# Patient Record
Sex: Female | Born: 1960 | Race: White | Hispanic: No | Marital: Married | Smoking: Never smoker
Health system: Southern US, Community
[De-identification: ages and names within clinical notes are randomized; demographics above are authoritative.]

## PROBLEM LIST (undated history)

## (undated) DIAGNOSIS — Z801 Family history of malignant neoplasm of trachea, bronchus and lung: Secondary | ICD-10-CM

## (undated) DIAGNOSIS — Z803 Family history of malignant neoplasm of breast: Secondary | ICD-10-CM

## (undated) DIAGNOSIS — C801 Malignant (primary) neoplasm, unspecified: Secondary | ICD-10-CM

## (undated) DIAGNOSIS — J45909 Unspecified asthma, uncomplicated: Secondary | ICD-10-CM

## (undated) HISTORY — PX: THYROID SURGERY: SHX805

## (undated) HISTORY — PX: BREAST SURGERY: SHX581

## (undated) HISTORY — DX: Family history of malignant neoplasm of trachea, bronchus and lung: Z80.1

## (undated) HISTORY — DX: Family history of malignant neoplasm of breast: Z80.3

---

## 2020-02-11 ENCOUNTER — Other Ambulatory Visit: Payer: Self-pay | Admitting: Physician Assistant

## 2020-02-11 ENCOUNTER — Ambulatory Visit
Admission: RE | Admit: 2020-02-11 | Discharge: 2020-02-11 | Disposition: A | Discharge status: Home / Self Care | Payer: BC Managed Care – PPO | Source: Ambulatory Visit | Attending: Physician Assistant | Admitting: Physician Assistant

## 2020-02-11 DIAGNOSIS — R918 Other nonspecific abnormal finding of lung field: Secondary | ICD-10-CM

## 2020-02-11 DIAGNOSIS — R222 Localized swelling, mass and lump, trunk: Secondary | ICD-10-CM

## 2020-02-13 ENCOUNTER — Ambulatory Visit
Admission: RE | Admit: 2020-02-13 | Discharge: 2020-02-13 | Disposition: A | Discharge status: Home / Self Care | Payer: BC Managed Care – PPO | Source: Ambulatory Visit | Attending: Physician Assistant | Admitting: Physician Assistant

## 2020-02-13 ENCOUNTER — Other Ambulatory Visit: Payer: Self-pay

## 2020-02-13 DIAGNOSIS — R222 Localized swelling, mass and lump, trunk: Secondary | ICD-10-CM

## 2020-02-13 DIAGNOSIS — R918 Other nonspecific abnormal finding of lung field: Secondary | ICD-10-CM

## 2020-02-13 MED ORDER — IOPAMIDOL (ISOVUE-300) INJECTION 61%
75.0000 mL | Freq: Once | INTRAVENOUS | Status: AC | PRN
  Administered 2020-02-13: 75 mL via INTRAVENOUS

## 2020-02-20 ENCOUNTER — Telehealth: Payer: Self-pay | Admitting: Hematology and Oncology

## 2020-02-20 NOTE — Progress Notes (Signed)
[facility] NOTE  Patient Care Team: Ephriam Jenkins, PA as PCP - General (Physician Assistant)  CHIEF COMPLAINTS/PURPOSE OF CONSULTATION:  Newly diagnosed breast cancer  HISTORY OF PRESENTING ILLNESS:  Melanie Davis 60 y.o. female is here because of recent diagnosis of metastatic breast cancer. She has a history of right breast cancer in 2010 for which she underwent a right mastectomy with reconstruction in 2011. She was diagnosed with left breast cancer in 2016 and underwent a left mastectomy with reconstruction on 12/04/2014 for which pathology showed invasive ductal carcinoma, grade 2, ER+ 75%, PR+ 75%, HER-2 negative (1+), clear margins, 2 left axillary lymph nodes negative for carcinoma. She recently palpated a left breast mass for about 6 weeks. Chest CT on 02/11/20 showed multiple bilateral pulmonary nodules consistent with metastatic disease. She presents to the clinic today for initial evaluation and discussion of treatment options.   I reviewed her records extensively and collaborated the history with the patient.  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 CT chest 02/10/2018 [facility]: Heterogeneous mass left anterior chest bordering the sternum 6.2 x 4 x 4.2 cm.  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  MEDICATIONS:  Current Outpatient Medications  Medication Sig Dispense Refill  . anastrozole (ARIMIDEX) 1 MG tablet Take 1 tablet (1 mg total) by mouth daily. 90 tablet 3  . triamcinolone ointment (KENALOG) 0.5 % Apply 1 application topically 2 (two) times daily. 30 g 0   No current facility-administered medications for this visit.    REVIEW OF SYSTEMS:   Constitutional: Denies fevers, chills or abnormal night sweats Skin: Skin rash on the left side of the neck Breast: Palpable left anterior chest wall mass All other systems were reviewed with the patient and are negative.  PHYSICAL EXAMINATION: ECOG PERFORMANCE STATUS: 1 - Symptomatic but completely ambulatory  Vitals:   02/21/20 1324  BP: (!) 141/88  Pulse: 83  Resp: 17  Temp: 97.9 F (36.6 C)  SpO2: 99%   Filed Weights   02/21/20 1324  Weight: 152 lb 4.8 oz (69.1 kg)    GENERAL:alert, no distress and comfortable SKIN: skin color, texture, turgor are normal, no rashes or significant lesions EYES: normal, conjunctiva are pink and non-injected, sclera clear OROPHARYNX:no exudate, no erythema and lips, buccal mucosa, and tongue normal  NECK: supple, thyroid normal size, non-tender, without nodularity LYMPH:  no palpable lymphadenopathy in the cervical, axillary or inguinal LUNGS: clear to auscultation and percussion with normal breathing effort HEART: regular rate & rhythm and no murmurs and no lower extremity edema  ABDOMEN:abdomen soft, non-tender and normal bowel sounds Musculoskeletal:no cyanosis of digits and no clubbing  PSYCH: alert & oriented x 3 with fluent speech NEURO: no focal motor/sensory  deficits BREAST: Left medial anterior chest wall mass (exam performed in the presence of a chaperone)     LABORATORY DATA:  I have reviewed the data as listed Lab Results  Component Value Date   WBC 6.8 02/21/2020   HGB 14.8 02/21/2020   HCT 45.7 02/21/2020   MCV 97.0 02/21/2020   PLT 231 02/21/2020   Lab Results  Component Value Date   NA 142 02/21/2020   K 4.3 02/21/2020   CL 107 02/21/2020   CO2 26 02/21/2020    RADIOGRAPHIC STUDIES: I have personally reviewed the radiological reports and agreed with the findings in the report.  ASSESSMENT AND PLAN:  Metastatic breast cancer ([place]) Right breast cancer in 2010 for which she underwent a right mastectomy with reconstruction in 2011.  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   Recurrence: She recently palpated a left chest wall mass for about 6 weeks.  Chest CT on 02/11/20 showed left anterior chest mass 6.2 cm.  Multiple bilateral pulmonary nodules consistent with metastatic disease  Treatment plan: 1.  Biopsy of the breast and the chest wall mass 2. PET CT scan  Treatment will depend on prognostic markers. Enrolled her in the blood draw study for metastatic breast cancer.   All questions were answered. The patient knows to call the clinic with any problems, questions or concerns.   Rulon Eisenmenger, MD, MPH 02/21/2020    I, Molly Dorshimer, am acting as scribe for Nicholas Lose, MD.  I have reviewed the above documentation for accuracy and completeness, and I agree with the above.

## 2020-02-20 NOTE — Telephone Encounter (Signed)
Received a new pt referral from [facility] for multiple bilateral pulm nodules found on CT consistent w/metastatic disease. Pt has a personal hx of brca. Ms. Bo cld to schedule an app w/Dr. Lindi Adie on 1/21 at 1pm. Pt aware to arrive 20 minutes early.

## 2020-02-21 ENCOUNTER — Inpatient Hospital Stay: Payer: BC Managed Care – PPO | Attending: Hematology and Oncology

## 2020-02-21 ENCOUNTER — Other Ambulatory Visit: Payer: Self-pay

## 2020-02-21 ENCOUNTER — Inpatient Hospital Stay (HOSPITAL_BASED_OUTPATIENT_CLINIC_OR_DEPARTMENT_OTHER): Payer: BC Managed Care – PPO | Admitting: Hematology and Oncology

## 2020-02-21 DIAGNOSIS — Z86000 Personal history of in-situ neoplasm of breast: Secondary | ICD-10-CM | POA: Diagnosis not present

## 2020-02-21 DIAGNOSIS — Z9013 Acquired absence of bilateral breasts and nipples: Secondary | ICD-10-CM | POA: Diagnosis not present

## 2020-02-21 DIAGNOSIS — C7801 Secondary malignant neoplasm of right lung: Secondary | ICD-10-CM

## 2020-02-21 DIAGNOSIS — C50812 Malignant neoplasm of overlapping sites of left female breast: Secondary | ICD-10-CM | POA: Insufficient documentation

## 2020-02-21 DIAGNOSIS — C50919 Malignant neoplasm of unspecified site of unspecified female breast: Secondary | ICD-10-CM

## 2020-02-21 DIAGNOSIS — C7802 Secondary malignant neoplasm of left lung: Secondary | ICD-10-CM

## 2020-02-21 LAB — CMP (CANCER CENTER ONLY)
ALT: 18 U/L (ref 0–44)
AST: 20 U/L (ref 15–41)
Albumin: 4.3 g/dL (ref 3.5–5.0)
Alkaline Phosphatase: 60 U/L (ref 38–126)
Anion gap: 9 (ref 5–15)
BUN: 13 mg/dL (ref 6–20)
CO2: 26 mmol/L (ref 22–32)
Calcium: 9.4 mg/dL (ref 8.9–10.3)
Chloride: 107 mmol/L (ref 98–111)
Creatinine: 0.87 mg/dL (ref 0.44–1.00)
GFR, Estimated: >60 mL/min (ref >60)
Glucose, Bld: 99 mg/dL (ref 70–99)
Potassium: 4.3 mmol/L (ref 3.5–5.1)
Sodium: 142 mmol/L (ref 135–145)
Total Bilirubin: 0.4 mg/dL (ref 0.3–1.2)
Total Protein: 7.2 g/dL (ref 6.5–8.1)

## 2020-02-21 LAB — CBC WITH DIFFERENTIAL (CANCER CENTER ONLY)
Abs Immature Granulocytes: 0.02 10*3/uL (ref 0.00–0.07)
Basophils Absolute: 0.1 10*3/uL (ref 0.0–0.1)
Basophils Relative: 1 %
Eosinophils Absolute: 0.4 10*3/uL (ref 0.0–0.5)
Eosinophils Relative: 6 %
HCT: 45.7 % (ref 36.0–46.0)
Hemoglobin: 14.8 g/dL (ref 12.0–15.0)
Immature Granulocytes: 0 %
Lymphocytes Relative: 31 %
Lymphs Abs: 2.1 10*3/uL (ref 0.7–4.0)
MCH: 31.4 pg (ref 26.0–34.0)
MCHC: 32.4 g/dL (ref 30.0–36.0)
MCV: 97 fL (ref 80.0–100.0)
Monocytes Absolute: 0.4 10*3/uL (ref 0.1–1.0)
Monocytes Relative: 6 %
Neutro Abs: 3.8 10*3/uL (ref 1.7–7.7)
Neutrophils Relative %: 56 %
Platelet Count: 231 10*3/uL (ref 150–400)
RBC: 4.71 MIL/uL (ref 3.87–5.11)
RDW: 13.2 % (ref 11.5–15.5)
WBC Count: 6.8 10*3/uL (ref 4.0–10.5)
nRBC: 0 % (ref 0.0–0.2)

## 2020-02-21 LAB — RESEARCH LABS

## 2020-02-21 MED ORDER — TRIAMCINOLONE ACETONIDE 0.5 % EX OINT: 1.0000 "application " | TOPICAL_OINTMENT | Freq: Two times a day (BID) | CUTANEOUS | 0 refills | Status: AC

## 2020-02-21 MED ORDER — ANASTROZOLE 1 MG PO TABS: 1.0000 mg | ORAL_TABLET | Freq: Every day | ORAL | 3 refills | Status: DC

## 2020-02-21 NOTE — Assessment & Plan Note (Addendum)
Right breast cancer in 2010 for which she underwent a right mastectomy with reconstruction in 2011.  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   Recurrence: She recently palpated a left chest wall mass for about 6 weeks.  Chest CT on 02/11/20 showed left anterior chest mass 6.2 cm.  Multiple bilateral pulmonary nodules consistent with metastatic disease  Treatment plan: 1.  Biopsy of the breast and the chest wall mass 2. PET CT scan  Treatment will depend on prognostic markers. Enrolled her in the blood draw study for metastatic breast cancer.

## 2020-02-24 ENCOUNTER — Other Ambulatory Visit: Payer: Self-pay | Admitting: Hematology and Oncology

## 2020-02-24 ENCOUNTER — Encounter (HOSPITAL_COMMUNITY): Payer: Self-pay | Admitting: Radiology

## 2020-02-24 DIAGNOSIS — C50919 Malignant neoplasm of unspecified site of unspecified female breast: Secondary | ICD-10-CM

## 2020-02-24 NOTE — Progress Notes (Signed)
[facility] Female, 60 y.o., 08-29-1960  MRN:  [id number] Phone:  [phone] Jerilynn Mages)       PCP:  Ephriam Jenkins, PA Coverage:  Sher[facility] P[facility]  Next Appt With Radiology ([facility]-US 2) 02/28/2020 at 8:00 AM           RE: STAT  US CORE BIOPSY (SOFT TISSUE) Received: Today Criselda Peaches, MD  Garth Bigness D  Discussed with Dr. Lindi Adie. Remote hx of breast cancer now with what appears to be bulky metastatic left IMA chain lymphadenopathy. Enough intercostal extension to allow for US guided core biopsy.   Pt is the [employer].   HKM        Previous Messages   ----- Message -----  From: Garth Bigness D  Sent: 02/24/2020 11:55 AM EST  To: Criselda Peaches, MD  Subject: STAT  US CORE BIOPSY (SOFT TISSUE)        Procedure: [place] CORE BIOPSY (SOFT TISSUE)   Reason: Metastatic breast cancer, Chest wall mass,  Discussed with Dr. Laurence Ferrari   History: CT in computer, NM PET to be scheduled   Provider: Nicholas Lose   Provider Contact: [phone]

## 2020-02-27 ENCOUNTER — Other Ambulatory Visit: Payer: Self-pay | Admitting: Radiology

## 2020-02-27 NOTE — H&P (Incomplete)
Chief Complaint: Patient was seen in consultation today for US-guided core biopsy  Referring Physician(s): Nicholas Lose  Supervising Physician: {Supervising Physician:[id number]}  Patient Status: [facility] - Out-pt  History of Present Illness: Melanie Davis is a 60 y.o. female with a medical history significant for HTN, asthma, thyroidectomy, hysterectomy and bilateral breast cancer. She underwent a right mastectomy with reconstruction in 2011 and a left mastectomy with reconstruction in 2016. She recently palpated a left chest wall mass and imaging was obtained.   CT chest with contrast 02/13/20 IMPRESSION: 1. 6.2 x 4.0 x 4.2 cm mass that involves the anterior left upper lobe and chest wall. An origin from the chest wall is suspected supported by the internal mammary vessels traveling through the mass. The mass does not erode the contiguous left sternum or adjacent anterior second and third left rib cartilages. Metastatic disease from breast carcinoma is suspected, possibly arising along the left internal mammary chain. 2. Multiple bilateral lung nodules as described consistent with pulmonary metastatic disease. 3. No other findings of metastatic disease within the chest or visualized upper abdomen. 4. No acute findings. 5. Low-attenuation liver masses are noted that are consistent with cysts.  Interventional Radiology has been asked to evaluate this patient for an image-guided chest wall mass biopsy for further work up and evaluation. Images have been reviewed and procedure approved by Dr. Laurence Ferrari.   No past medical history on file.  *** The histories are not reviewed yet. Please review them in the "History" navigator section and refresh this [place].  Allergies: Patient has no known allergies.  Medications: Prior to Admission medications   Medication Sig Start Date End Date Taking? Authorizing Provider  albuterol (VENTOLIN HFA) 108 (90 Base) MCG/ACT inhaler Inhale 1 puff  into the lungs every 4 (four) hours as needed for wheezing or shortness of breath.   Yes [provider]  anastrozole (ARIMIDEX) 1 MG tablet Take 1 tablet (1 mg total) by mouth daily. 02/21/20  Yes Nicholas Lose, MD  beclomethasone (QVAR) 40 MCG/ACT inhaler Inhale 2 puffs into the lungs 2 (two) times daily.   Yes [provider]  fluticasone (FLONASE) 50 MCG/ACT nasal spray Place 1 spray into both nostrils daily as needed for allergies or rhinitis.   Yes [provider]  levothyroxine (SYNTHROID) 100 MCG tablet Take 100 mcg by mouth daily before breakfast.   Yes [provider]  lisinopril (ZESTRIL) 5 MG tablet Take 5 mg by mouth daily.   Yes [provider]  triamcinolone ointment (KENALOG) 0.5 % Apply 1 application topically 2 (two) times daily. 02/21/20   Nicholas Lose, MD     No family history on file.  Social History   Socioeconomic History  . Marital status: Unknown    Spouse name: Not on file  . Number of children: Not on file  . Years of education: Not on file  . Highest education level: Not on file  Occupational History  . Not on file  Tobacco Use  . Smoking status: Not on file  . Smokeless tobacco: Not on file  Substance and Sexual Activity  . Alcohol use: Not on file  . Drug use: Not on file  . Sexual activity: Not on file  Other Topics Concern  . Not on file  Social History Narrative  . Not on file   Social Determinants of Health   Financial Resource Strain: Not on file  Food Insecurity: Not on file  Transportation Needs: Not on file  Physical Activity: Not  on file  Stress: Not on file  Social Connections: Not on file    Review of Systems: A 12 point ROS discussed and pertinent positives are indicated in the HPI above.  All other systems are negative.  Review of Systems  Vital Signs: There were no vitals taken for this visit.  Physical Exam  Imaging: DG Chest 2 View  Result Date: 02/11/2020 CLINICAL DATA:   Left chest wall mass for 2 weeks. Prior left mastectomy in 2015. Right mastectomy in 2010. History of hypertension and asthma EXAM: CHEST - 2 VIEW COMPARISON:  None. FINDINGS: Heart size is within normal limits. No pulmonary vascular congestion. Surgical clips noted in the left axilla and midline upper thorax. There are 2 possible nodules in the left lower lung measuring 7 mm each. Lungs otherwise clear. There is a masslike opacity in the retrosternal region seen on the lateral view measuring approximately 5.9 x 2.7 cm. IMPRESSION: 1. No acute cardiopulmonary process. 2. Masslike opacity in the retrosternal region seen on the lateral view measuring approximately 5.9 x 2.7 cm. 3. Two 7 mm nodular opacity seen in the left lower lung. 4. Further evaluation with contrast enhanced chest CT should be performed to evaluate these findings. These results will be called to the ordering clinician or representative by the Radiologist Assistant, and communication documented in the PACS or [employer]. Electronically Signed   By: Miachel Roux M.D.   On: 02/11/2020 09:40   CT CHEST W CONTRAST  Result Date: 02/13/2020 CLINICAL DATA:  Masslike opacity in the retrosternal region on a lateral chest radiograph performed 02/11/2020, also showing 2 nodular opacities in the left lower lung. Current exam is for further assessment. History of prior mastectomies. EXAM: CT CHEST WITH CONTRAST TECHNIQUE: Multidetector CT imaging of the chest was performed during intravenous contrast administration. CONTRAST:  7mL ISOVUE-300 IOPAMIDOL (ISOVUE-300) INJECTION 61% COMPARISON:  Chest radiograph, 02/11/2020. FINDINGS: Cardiovascular: Heart is normal in size and configuration. No pericardial effusion or coronary artery calcifications. Mild atherosclerotic calcifications along the aortic arch. Great vessels otherwise unremarkable. Aortic arch branch vessels are widely patent. Mediastinum/Nodes: Surgical vascular clips at the neck base  consistent with prior thyroidectomy. No neck base or axillary masses or enlarged lymph nodes. The trachea and esophagus are unremarkable. Lungs/Pleura: Heterogeneous mass in the left anterior chest, bordering the left margin of the sternum, involving the chest wall and mildly displacing the overlying medial pectoralis major muscle anteriorly. There is no resorption of the sternum where it abuts the mass or the associated rib cartilages, which are the second and third left rib cartilages. This mass measures 6.2 x 4.0 x 4.2 cm. The internal mammary vessels track through the mass. Multiple small pulmonary nodules bilaterally, larger and somewhat more numerous on the left. The larger nodules are measured. In the left upper lobe, image 63, series 3, there is a 5 mm nodule. In the left lower lobe, image 94, series 3, there is a 7 mm nodule. There are 2 adjacent nodules in the left lower lobe, largest measuring 5 mm, image 98, series 3. 6 mm nodule in the left upper lobe lingula, image 116. Largest right nodule lies in the right middle lobe measuring 5 mm. There are hazy, linear and reticulonodular type opacities in the anterior left upper lobe adjacent to the above described mass. No evidence of pneumonia or pulmonary edema. No pleural effusion and no pneumothorax. Upper Abdomen: 4 low-density liver masses, largest at the dome of the right lobe measuring 3 cm. These  are likely cysts. Visualized upper abdomen is otherwise unremarkable. Musculoskeletal: Fractures of the right lateral seventh and eighth ribs that appear chronic to subacute. No acute fractures. No osteoblastic or osteolytic lesions. Bilateral mastectomies, implant reconstruction on the left. No other chest wall masses. IMPRESSION: 1. 6.2 x 4.0 x 4.2 cm mass that involves the anterior left upper lobe and chest wall. An origin from the chest wall is suspected supported by the internal mammary vessels traveling through the mass. The mass does not erode the  contiguous left sternum or adjacent anterior second and third left rib cartilages. Metastatic disease from breast carcinoma is suspected, possibly arising along the left internal mammary chain. 2. Multiple bilateral lung nodules as described consistent with pulmonary metastatic disease. 3. No other findings of metastatic disease within the chest or visualized upper abdomen. 4. No acute findings. 5. Low-attenuation liver masses are noted that are consistent with cysts. Aortic Atherosclerosis (ICD10-I70.0). Electronically Signed   By: Lajean Manes M.D.   On: 02/13/2020 14:50    Labs:  CBC: Recent Labs    02/21/20 1411  WBC 6.8  HGB 14.8  HCT 45.7  PLT 231    COAGS: No results for input(s): INR, APTT in the last 8760 hours.  BMP: Recent Labs    02/21/20 1411  NA 142  K 4.3  CL 107  CO2 26  GLUCOSE 99  BUN 13  CALCIUM 9.4  CREATININE 0.87  GFRNONAA >60    LIVER FUNCTION TESTS: Recent Labs    02/21/20 1411  BILITOT 0.4  AST 20  ALT 18  ALKPHOS 60  PROT 7.2  ALBUMIN 4.3    TUMOR MARKERS: No results for input(s): AFPTM, CEA, CA199, CHROMGRNA in the last 8760 hours.  Assessment and Plan:  Breast cancer with suspected metastases: Melanie Davis, 60 year old female, presents today to the [facility] department for an image-guided chest wall mass biopsy.   Risks and benefits of this procedure were discussed with the patient and/or patient's family including, but not limited to bleeding, infection, damage to adjacent structures or low yield requiring additional tests.  All of the questions were answered and there is agreement to proceed.  Consent signed and in chart.  Thank you for this interesting consult.  I greatly enjoyed meeting Melanie Davis and look forward to participating in their care.  A copy of this report was sent to the requesting provider on this date.  Electronically Signed: Soyla Dryer, AGACNP-BC [id number] 02/27/2020,  10:22 AM   I spent a total of  30 Minutes   in face to face in clinical consultation, greater than 50% of which was counseling/coordinating care for chest wall mass biopsy.

## 2020-02-28 ENCOUNTER — Other Ambulatory Visit: Payer: Self-pay | Admitting: Hematology and Oncology

## 2020-02-28 ENCOUNTER — Ambulatory Visit (HOSPITAL_COMMUNITY)
Admission: RE | Admit: 2020-02-28 | Discharge: 2020-02-28 | Disposition: A | Discharge status: Home / Self Care | Payer: BC Managed Care – PPO | Source: Ambulatory Visit | Attending: Hematology and Oncology | Admitting: Hematology and Oncology

## 2020-02-28 ENCOUNTER — Other Ambulatory Visit: Payer: Self-pay

## 2020-02-28 ENCOUNTER — Encounter (HOSPITAL_COMMUNITY): Payer: Self-pay

## 2020-02-28 DIAGNOSIS — C50919 Malignant neoplasm of unspecified site of unspecified female breast: Secondary | ICD-10-CM | POA: Diagnosis not present

## 2020-02-28 HISTORY — DX: Unspecified asthma, uncomplicated: J45.909

## 2020-02-28 HISTORY — DX: Malignant (primary) neoplasm, unspecified: C80.1

## 2020-02-28 LAB — CBC
HCT: 44.4 % (ref 36.0–46.0)
Hemoglobin: 14.8 g/dL (ref 12.0–15.0)
MCH: 32.3 pg (ref 26.0–34.0)
MCHC: 33.3 g/dL (ref 30.0–36.0)
MCV: 96.9 fL (ref 80.0–100.0)
Platelets: 237 10*3/uL (ref 150–400)
RBC: 4.58 MIL/uL (ref 3.87–5.11)
RDW: 13.4 % (ref 11.5–15.5)
WBC: 5.5 10*3/uL (ref 4.0–10.5)
nRBC: 0 % (ref 0.0–0.2)

## 2020-02-28 LAB — PROTIME-INR
INR: 0.9 (ref 0.8–1.2)
Prothrombin Time: 11.4 seconds (ref 11.4–15.2)

## 2020-02-28 MED ORDER — SODIUM CHLORIDE 0.9 % IV SOLN: INTRAVENOUS | Status: DC

## 2020-02-28 MED ORDER — MIDAZOLAM HCL 2 MG/2ML IJ SOLN
INTRAMUSCULAR | Status: AC
  Filled 2020-02-28: qty 2

## 2020-02-28 MED ORDER — MIDAZOLAM HCL 2 MG/2ML IJ SOLN
INTRAMUSCULAR | Status: AC | PRN
  Administered 2020-02-28: 1 mg via INTRAVENOUS
  Administered 2020-02-28: 0.5 mg via INTRAVENOUS

## 2020-02-28 MED ORDER — FENTANYL CITRATE (PF) 100 MCG/2ML IJ SOLN
INTRAMUSCULAR | Status: AC | PRN
  Administered 2020-02-28: 50 ug via INTRAVENOUS
  Administered 2020-02-28: 25 ug via INTRAVENOUS

## 2020-02-28 MED ORDER — FENTANYL CITRATE (PF) 100 MCG/2ML IJ SOLN
INTRAMUSCULAR | Status: AC
  Filled 2020-02-28: qty 2

## 2020-02-28 NOTE — Sedation Documentation (Signed)
This RN placed call to Short Stay unit to give report, spoke to [place].  Short Stay unable to take report at this time. Will try again shortly.

## 2020-02-28 NOTE — Procedures (Signed)
Interventional Radiology Procedure Note  Procedure:   CT guided biopsy of chest wall mass.  Mx 18g core.  Complications: None  Recommendations:  - routine wound care - Do not submerge x 7 days - Routine care  - advance diet - 1 hr dc home  Signed,  Dulcy Fanny. Earleen Newport, DO

## 2020-02-28 NOTE — Consult Note (Signed)
Chief Complaint: Patient was seen in consultation today for image guided left chest wall mass biopsy  Referring Physician(s): Kinta  Supervising Physician: Corrie Mckusick  Patient Status: [facility] - Out-pt  History of Present Illness: Melanie Davis is a 60 y.o. female with history of right breast cancer in 2010 with mastectomy and reconstruction as well as left breast cancer in 2016 with mastectomy and reconstruction.  For the past months she has noticed an enlarging solid left upper chest wall mass which is slightly tender to palpation.  CT chest performed on 02/13/20 revealed:  1. 6.2 x 4.0 x 4.2 cm mass that involves the anterior left upper lobe and chest wall. An origin from the chest wall is suspected supported by the internal mammary vessels traveling through the mass. The mass does not erode the contiguous left sternum or adjacent anterior second and third left rib cartilages. Metastatic disease from breast carcinoma is suspected, possibly arising along the left internal mammary chain. 2. Multiple bilateral lung nodules as described consistent with pulmonary metastatic disease. 3. No other findings of metastatic disease within the chest or visualized upper abdomen. 4. No acute findings. 5. Low-attenuation liver masses are noted that are consistent with Cysts.  She presents today for image guided left chest wall mass biopsy for further evaluation.  Past Medical History:  Diagnosis Date  . Asthma   . Cancer [facility])     Past Surgical History:  Procedure Laterality Date  . BREAST SURGERY    . THYROID SURGERY      Allergies: Patient has no known allergies.  Medications: Prior to Admission medications   Medication Sig Start Date End Date Taking? Authorizing Provider  albuterol (VENTOLIN HFA) 108 (90 Base) MCG/ACT inhaler Inhale 1 puff into the lungs every 4 (four) hours as needed for wheezing or shortness of breath.   Yes [provider]  anastrozole  (ARIMIDEX) 1 MG tablet Take 1 tablet (1 mg total) by mouth daily. 02/21/20  Yes Nicholas Lose, MD  beclomethasone (QVAR) 40 MCG/ACT inhaler Inhale into the lungs 2 (two) times daily.   Yes [provider]  fluticasone (FLONASE) 50 MCG/ACT nasal spray Place 1 spray into both nostrils daily as needed for allergies or rhinitis.   Yes [provider]  levothyroxine (SYNTHROID) 100 MCG tablet Take 100 mcg by mouth daily before breakfast.   Yes [provider]  lisinopril (ZESTRIL) 5 MG tablet Take 5 mg by mouth daily.   Yes [provider]  triamcinolone ointment (KENALOG) 0.5 % Apply 1 application topically 2 (two) times daily. 02/21/20  Yes Nicholas Lose, MD     Family History  Problem Relation Age of Onset  . Stroke Mother   . Pancreatitis Father     Social History   Socioeconomic History  . Marital status: Unknown    Spouse name: Not on file  . Number of children: Not on file  . Years of education: Not on file  . Highest education level: Not on file  Occupational History  . Not on file  Tobacco Use  . Smoking status: Never Smoker  . Smokeless tobacco: Never Used  Vaping Use  . Vaping Use: Never used  Substance and Sexual Activity  . Alcohol use: Yes    Comment: social  . Drug use: Never  . Sexual activity: Not on file  Other Topics Concern  . Not on file  Social History Narrative  . Not on file   Social Determinants of Health   Financial  Resource Strain: Not on file  Food Insecurity: Not on file  Transportation Needs: Not on file  Physical Activity: Not on file  Stress: Not on file  Social Connections: Not on file      Review of Systems see above; currently denies fever, headache, dyspnea, cough, abdominal/back pain, nausea, vomiting or bleeding.  Vital Signs: BP (!) 148/78   Pulse 76   Temp 97.6 F (36.4 C) (Oral)   Resp 16   Ht 5\' 6"  (1.676 m)   Wt 153 lb (69.4 kg)   SpO2 97%   BMI 24.69 kg/m   Physical Exam awake,  alert.  Chest clear to auscultation bilaterally.  Left ant/medial upper chest wall mass noted, slightly tender to palpation.  Heart with regular rate and rhythm.  Abdomen soft, positive bowel sounds, nontender.  No lower extremity edema.  Imaging: DG Chest 2 View  Result Date: 02/11/2020 CLINICAL DATA:  Left chest wall mass for 2 weeks. Prior left mastectomy in 2015. Right mastectomy in 2010. History of hypertension and asthma EXAM: CHEST - 2 VIEW COMPARISON:  None. FINDINGS: Heart size is within normal limits. No pulmonary vascular congestion. Surgical clips noted in the left axilla and midline upper thorax. There are 2 possible nodules in the left lower lung measuring 7 mm each. Lungs otherwise clear. There is a masslike opacity in the retrosternal region seen on the lateral view measuring approximately 5.9 x 2.7 cm. IMPRESSION: 1. No acute cardiopulmonary process. 2. Masslike opacity in the retrosternal region seen on the lateral view measuring approximately 5.9 x 2.7 cm. 3. Two 7 mm nodular opacity seen in the left lower lung. 4. Further evaluation with contrast enhanced chest CT should be performed to evaluate these findings. These results will be called to the ordering clinician or representative by the Radiologist Assistant, and communication documented in the PACS or [employer]. Electronically Signed   By: Miachel Roux M.D.   On: 02/11/2020 09:40   CT CHEST W CONTRAST  Result Date: 02/13/2020 CLINICAL DATA:  Masslike opacity in the retrosternal region on a lateral chest radiograph performed 02/11/2020, also showing 2 nodular opacities in the left lower lung. Current exam is for further assessment. History of prior mastectomies. EXAM: CT CHEST WITH CONTRAST TECHNIQUE: Multidetector CT imaging of the chest was performed during intravenous contrast administration. CONTRAST:  53mL ISOVUE-300 IOPAMIDOL (ISOVUE-300) INJECTION 61% COMPARISON:  Chest radiograph, 02/11/2020. FINDINGS: Cardiovascular:  Heart is normal in size and configuration. No pericardial effusion or coronary artery calcifications. Mild atherosclerotic calcifications along the aortic arch. Great vessels otherwise unremarkable. Aortic arch branch vessels are widely patent. Mediastinum/Nodes: Surgical vascular clips at the neck base consistent with prior thyroidectomy. No neck base or axillary masses or enlarged lymph nodes. The trachea and esophagus are unremarkable. Lungs/Pleura: Heterogeneous mass in the left anterior chest, bordering the left margin of the sternum, involving the chest wall and mildly displacing the overlying medial pectoralis major muscle anteriorly. There is no resorption of the sternum where it abuts the mass or the associated rib cartilages, which are the second and third left rib cartilages. This mass measures 6.2 x 4.0 x 4.2 cm. The internal mammary vessels track through the mass. Multiple small pulmonary nodules bilaterally, larger and somewhat more numerous on the left. The larger nodules are measured. In the left upper lobe, image 63, series 3, there is a 5 mm nodule. In the left lower lobe, image 94, series 3, there is a 7 mm nodule. There are 2 adjacent nodules  in the left lower lobe, largest measuring 5 mm, image 98, series 3. 6 mm nodule in the left upper lobe lingula, image 116. Largest right nodule lies in the right middle lobe measuring 5 mm. There are hazy, linear and reticulonodular type opacities in the anterior left upper lobe adjacent to the above described mass. No evidence of pneumonia or pulmonary edema. No pleural effusion and no pneumothorax. Upper Abdomen: 4 low-density liver masses, largest at the dome of the right lobe measuring 3 cm. These are likely cysts. Visualized upper abdomen is otherwise unremarkable. Musculoskeletal: Fractures of the right lateral seventh and eighth ribs that appear chronic to subacute. No acute fractures. No osteoblastic or osteolytic lesions. Bilateral mastectomies,  implant reconstruction on the left. No other chest wall masses. IMPRESSION: 1. 6.2 x 4.0 x 4.2 cm mass that involves the anterior left upper lobe and chest wall. An origin from the chest wall is suspected supported by the internal mammary vessels traveling through the mass. The mass does not erode the contiguous left sternum or adjacent anterior second and third left rib cartilages. Metastatic disease from breast carcinoma is suspected, possibly arising along the left internal mammary chain. 2. Multiple bilateral lung nodules as described consistent with pulmonary metastatic disease. 3. No other findings of metastatic disease within the chest or visualized upper abdomen. 4. No acute findings. 5. Low-attenuation liver masses are noted that are consistent with cysts. Aortic Atherosclerosis (ICD10-I70.0). Electronically Signed   By: Lajean Manes M.D.   On: 02/13/2020 14:50    Labs:  CBC: Recent Labs    02/21/20 1411 02/28/20 0625  WBC 6.8 5.5  HGB 14.8 14.8  HCT 45.7 44.4  PLT 231 237    COAGS: Recent Labs    02/28/20 0625  INR 0.9    BMP: Recent Labs    02/21/20 1411  NA 142  K 4.3  CL 107  CO2 26  GLUCOSE 99  BUN 13  CALCIUM 9.4  CREATININE 0.87  GFRNONAA >60    LIVER FUNCTION TESTS: Recent Labs    02/21/20 1411  BILITOT 0.4  AST 20  ALT 18  ALKPHOS 60  PROT 7.2  ALBUMIN 4.3    TUMOR MARKERS: No results for input(s): AFPTM, CEA, CA199, CHROMGRNA in the last 8760 hours.  Assessment and Plan: 60 y.o. female with history of right breast cancer in 2010 with mastectomy and reconstruction as well as left breast cancer in 2016 with mastectomy and reconstruction.  For the past months she has noticed an enlarging solid left upper chest wall mass which is slightly tender to palpation.  CT chest performed on 02/13/20 revealed:  1. 6.2 x 4.0 x 4.2 cm mass that involves the anterior left upper lobe and chest wall. An origin from the chest wall is suspected supported by the  internal mammary vessels traveling through the mass. The mass does not erode the contiguous left sternum or adjacent anterior second and third left rib cartilages. Metastatic disease from breast carcinoma is suspected, possibly arising along the left internal mammary chain. 2. Multiple bilateral lung nodules as described consistent with pulmonary metastatic disease. 3. No other findings of metastatic disease within the chest or visualized upper abdomen. 4. No acute findings. 5. Low-attenuation liver masses are noted that are consistent with Cysts.  She presents today for image guided left chest wall mass biopsy for further evaluation.Risks and benefits of procedure was discussed with the patient  including, but not limited to bleeding, infection, damage to adjacent structures,  pneumothorax or low yield requiring additional tests.  All of the questions were answered and there is agreement to proceed.  Consent signed and in chart.     Thank you for this interesting consult.  I greatly enjoyed meeting Ashwika Freels and look forward to participating in their care.  A copy of this report was sent to the requesting provider on this date.  Electronically Signed: D. Rowe Robert, PA-C 02/28/2020, 7:33 AM   I spent a total of 25 minutes  in face to face in clinical consultation, greater than 50% of which was counseling/coordinating care for image guided left chest wall mass biopsy

## 2020-02-28 NOTE — Discharge Instructions (Addendum)
Percutaneous Kidney Biopsy, Care After This sheet gives you information about how to care for yourself after your procedure. Your health care provider may also give you more specific instructions. If you have problems or questions, contact your health care provider. What can I expect after the procedure? After the procedure, it is common to have:  Pain or soreness near the biopsy site.  Pink or cloudy urine for 24 hours after the procedure. This is normal. Follow these instructions at home: Activity  Return to your normal activities as told by your health care provider. Ask your health care provider what activities are safe for you.  If you were given a sedative during the procedure, it can affect you for several hours. Do not drive or operate machinery until your health care provider says that it is safe.  Do not lift anything that is heavier than 10 lb (4.5 kg), or the limit that you are told, until your health care provider says that it is safe.  Avoid activities that take a lot of effort until your health care provider approves. Most people will have to wait 2 weeks before returning to activities such as exercise or sex. General instructions  Take over-the-counter and prescription medicines only as told by your health care provider.  Follow instructions from your health care provider about eating or drinking restrictions.  Check your biopsy site every day for signs of infection. Check for: ? More redness, swelling, or pain. ? Fluid or blood. ? Warmth. ? Pus or a bad smell.  Keep all follow-up visits as told by your health care provider. This is important.   Contact a health care provider if:  You have more redness, swelling, or pain around your biopsy site.  You have fluid or blood coming from your biopsy site.  Your biopsy site feels warm to the touch.  You have pus or a bad smell coming from your biopsy site.  You have blood in your urine more than 24 hours after your  procedure. Get help right away if:  Your urine is dark red or brown.  You have a fever.  You are not able to urinate.  You feel burning when you urinate.  You feel dizzy or light-headed.  You have severe pain in your abdomen or side. Summary  After the procedure, it is common to have pain or soreness at the biopsy site and pink or cloudy urine for the first 24 hours.  Check your biopsy site each day for signs of infection, such as more redness, swelling, or pain; fluid, blood, pus or a bad smell coming from the biopsy site; or the biopsy site feeling warm to the touch.  Return to your normal activities as told by your health care provider. This information is not intended to replace advice given to you by your health care provider. Make sure you discuss any questions you have with your health care provider. Document Revised: 04/12/2019 Document Reviewed: 04/12/2019 Elsevier Patient Education  2021 Else[employer] Inc. Moderate Conscious Sedation, Adult Sedation is the use of medicines to promote relaxation and to relieve discomfort and anxiety. Moderate conscious sedation is a type of sedation. Under moderate conscious sedation, you are less alert than normal, but you are still able to respond to instructions, touch, or both. Moderate conscious sedation is used during short medical and dental procedures. It is milder than deep sedation, which is a type of sedation under which you cannot be easily woken up. It is also milder than   general anesthesia, which is the use of medicines to make you unconscious. Moderate conscious sedation allows you to return to your regular activities sooner. Tell a health care provider about:  Any allergies you have.  All medicines you are taking, including vitamins, herbs, eye drops, creams, and over-the-counter medicines.  Any use of steroids. This includes steroids taken by mouth or as a cream.  Any problems you or family members have had with sedatives and  anesthetic medicines.  Any blood disorders you have.  Any surgeries you have had.  Any medical conditions you have, such as sleep apnea.  Whether you are pregnant or may be pregnant.  Any use of cigarettes, alcohol, marijuana, or drugs. What are the risks? Generally, this is a safe procedure. However, problems may occur, including:  Getting too much medicine (oversedation).  Nausea.  Allergic reaction to medicines.  Trouble breathing. If this happens, a breathing tube may be used. It will be removed when you are awake and breathing on your own.  Heart trouble.  Lung trouble.  Confusion that gets better with time (emergence delirium). What happens before the procedure? Staying hydrated Follow instructions from your health care provider about hydration, which may include:  Up to 2 hours before the procedure - you may continue to drink clear liquids, such as water, clear fruit juice, black coffee, and plain tea. Eating and drinking restrictions Follow instructions from your health care provider about eating and drinking, which may include:  8 hours before the procedure - stop eating heavy meals or foods, such as meat, fried foods, or fatty foods.  6 hours before the procedure - stop eating light meals or foods, such as toast or cereal.  6 hours before the procedure - stop drinking milk or drinks that contain milk.  2 hours before the procedure - stop drinking clear liquids. Medicines Ask your health care provider about:  Changing or stopping your regular medicines. This is especially important if you are taking diabetes medicines or blood thinners.  Taking medicines such as aspirin and ibuprofen. These medicines can thin your blood. Do not take these medicines unless your health care provider tells you to take them.  Taking over-the-counter medicines, vitamins, herbs, and supplements. Tests and exams  You will have a physical exam.  You may have blood tests done to  show how well: ? Your kidneys and liver work. ? Your blood clots. General instructions  Plan to have a responsible adult take you home from the hospital or clinic.  If you will be going home right after the procedure, plan to have a responsible adult care for you for the time you are told. This is important. What happens during the procedure?  You will be given the sedative. The sedative may be given: ? As a pill that you will swallow. It can also be inserted into the rectum. ? As a spray through the nose. ? As an injection into the muscle. ? As an injection into the vein through an IV.  You may be given oxygen as needed.  Your breathing, heart rate, and blood pressure will be monitored during the procedure.  The medical or dental procedure will be done. The procedure may vary among health care providers and hospitals.   What happens after the procedure?  Your blood pressure, heart rate, breathing rate, and blood oxygen level will be monitored until you leave the hospital or clinic.  You will get fluids through your IV if needed.  Do not drive   or operate machinery until your health care provider says that it is safe. Summary  Sedation is the use of medicines to promote relaxation and to relieve discomfort and anxiety. Moderate conscious sedation is a type of sedation that is used during short medical and dental procedures.  Tell the health care provider about any medical conditions that you have and about all the medicines that you are taking.  You will be given the sedative as a pill, a spray through the nose, an injection into the muscle, or an injection into the vein through an IV. Vital signs are monitored during the sedation.  Moderate conscious sedation allows you to return to your regular activities sooner. This information is not intended to replace advice given to you by your health care provider. Make sure you discuss any questions you have with your health care  provider. Document Revised: 05/17/2019 Document Reviewed: 12/13/2018 Elsevier Patient Education  2021 [employer] Inc.  

## 2020-03-03 ENCOUNTER — Encounter: Payer: Self-pay | Admitting: Adult Health

## 2020-03-03 ENCOUNTER — Encounter: Payer: Self-pay | Admitting: Hematology and Oncology

## 2020-03-04 ENCOUNTER — Other Ambulatory Visit: Payer: Self-pay

## 2020-03-04 ENCOUNTER — Ambulatory Visit (HOSPITAL_COMMUNITY)
Admission: RE | Admit: 2020-03-04 | Discharge: 2020-03-04 | Disposition: A | Discharge status: Home / Self Care | Payer: BC Managed Care – PPO | Source: Ambulatory Visit | Attending: Hematology and Oncology | Admitting: Hematology and Oncology

## 2020-03-04 DIAGNOSIS — C50919 Malignant neoplasm of unspecified site of unspecified female breast: Secondary | ICD-10-CM | POA: Diagnosis not present

## 2020-03-04 LAB — GLUCOSE, CAPILLARY: Glucose-Capillary: 90 mg/dL (ref 70–99)

## 2020-03-04 LAB — SURGICAL PATHOLOGY

## 2020-03-04 MED ORDER — FLUDEOXYGLUCOSE F - 18 (FDG) INJECTION
7.5100 | Freq: Once | INTRAVENOUS | Status: AC | PRN
  Administered 2020-03-04: 7.51 via INTRAVENOUS

## 2020-03-04 NOTE — Progress Notes (Signed)
[facility] up:    Ephriam Jenkins, PA [address] [place] [address]   DIAGNOSIS: Cancer Staging No matching staging information was found for the patient.  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 CT chest 02/10/2018 [facility]: Heterogeneous mass left anterior chest bordering the sternum 6.2 x 4 x 4.2 cm.  Multiple bilateral lung nodules LUL: 5 mm, LLL: 7 mm, 2 adjacent nodules LLL: 5 mm, LUL lingula: 6 mm, RML: 5 mm, hazy linear reticulonodular opacities, 4 low-density liver masses 3 cm likely cysts     CURRENT THERAPY:  INTERVAL HISTORY: Melanie Davis 60 y.o. female returns for evaluation of her left chest wall biopsy results.  She underwent a PET scan on 03/03/2020 that showed a hypermetabolic right chest wall mass, and lung nodules that are consistent with pulmonary metastases, however less than 42m.  She was started on anastrozole daily and tolerates it well. She notes some mild tenderness over the left chest wall mass, but otherwise is doing well today without any concerns.     Patient Active Problem List   Diagnosis Date Noted  . Metastatic breast cancer ([place] 02/21/2020    has No Known Allergies.  MEDICAL HISTORY: Past Medical History:  Diagnosis Date  . Asthma   . Cancer ([facility]     SURGICAL HISTORY: Past Surgical History:  Procedure Laterality Date  . BREAST SURGERY    . THYROID SURGERY      SOCIAL  HISTORY: Social History   Socioeconomic History  . Marital status: Unknown    Spouse name: Not on file  . Number of children: Not on file  . Years of education: Not on file  . Highest education level: Not on file  Occupational History  . Not on file  Tobacco Use  . Smoking status: Never Smoker  . Smokeless tobacco: Never Used  Vaping Use  . Vaping Use: Never used  Substance and Sexual Activity  . Alcohol use: Yes    Comment: social  . Drug use: Never  . Sexual activity: Not on file  Other Topics Concern  . Not on file  Social History Narrative  . Not on file   Social Determinants of Health   Financial Resource Strain: Not on file  Food Insecurity: Not on file  Transportation Needs: Not on file  Physical Activity: Not on file  Stress: Not on file  Social Connections: Not on file  Intimate Partner Violence: Not on file    FAMILY HISTORY: Family History  Problem Relation Age of Onset  . Stroke Mother   . Pancreatitis Father     Review of Systems  Constitutional: Negative for appetite change, chills, fatigue, fever and unexpected weight change.  HENT:   Negative for hearing loss, lump/mass and trouble swallowing.   Eyes: Negative for eye problems and icterus.  Respiratory: Negative for chest tightness, cough and shortness of breath.   Cardiovascular: Negative for chest pain, leg swelling and palpitations.  Gastrointestinal: Negative for abdominal  distention, abdominal pain, constipation, diarrhea, nausea and vomiting.  Endocrine: Negative for hot flashes.  Genitourinary: Negative for difficulty urinating.   Musculoskeletal: Negative for arthralgias.  Skin: Negative for itching and rash.  Neurological: Negative for dizziness, extremity weakness, headaches and numbness.  Hematological: Negative for adenopathy. Does not bruise/bleed easily.  Psychiatric/Behavioral: Negative for depression. The patient is not nervous/anxious.       PHYSICAL EXAMINATION  ECOG  PERFORMANCE STATUS: 1 - Symptomatic but completely ambulatory  Vitals:   03/05/20 1500  BP: (!) 145/70  Pulse: 89  Resp: 20  Temp: (!) 97.5 F (36.4 C)  SpO2: 98%    Physical Exam Constitutional:      General: She is not in acute distress.    Appearance: Normal appearance. She is not toxic-appearing.  HENT:     Head: Normocephalic and atraumatic.  Eyes:     General: No scleral icterus. Cardiovascular:     Rate and Rhythm: Normal rate and regular rhythm.     Pulses: Normal pulses.     Heart sounds: Normal heart sounds.  Pulmonary:     Effort: Pulmonary effort is normal.     Breath sounds: Normal breath sounds.  Abdominal:     General: Abdomen is flat. Bowel sounds are normal. There is no distension.     Palpations: Abdomen is soft.     Tenderness: There is no abdominal tenderness.  Musculoskeletal:        General: No swelling.     Cervical back: Neck supple.  Lymphadenopathy:     Cervical: No cervical adenopathy.  Skin:    General: Skin is warm and dry.     Findings: No rash.  Neurological:     General: No focal deficit present.     Mental Status: She is alert.  Psychiatric:        Mood and Affect: Mood normal.        Behavior: Behavior normal.     LABORATORY DATA:  CBC    Component Value Date/Time   WBC 5.5 02/28/2020 0625   RBC 4.58 02/28/2020 0625   HGB 14.8 02/28/2020 0625   HGB 14.8 02/21/2020 1411   HCT 44.4 02/28/2020 0625   PLT 237 02/28/2020 0625   PLT 231 02/21/2020 1411   MCV 96.9 02/28/2020 0625   MCH 32.3 02/28/2020 0625   MCHC 33.3 02/28/2020 0625   RDW 13.4 02/28/2020 0625   LYMPHSABS 2.1 02/21/2020 1411   MONOABS 0.4 02/21/2020 1411   EOSABS 0.4 02/21/2020 1411   BASOSABS 0.1 02/21/2020 1411    CMP     Component Value Date/Time   NA 142 02/21/2020 1411   K 4.3 02/21/2020 1411   CL 107 02/21/2020 1411   CO2 26 02/21/2020 1411   GLUCOSE 99 02/21/2020 1411   BUN 13 02/21/2020 1411   CREATININE 0.87 02/21/2020 1411   CALCIUM  9.4 02/21/2020 1411   PROT 7.2 02/21/2020 1411   ALBUMIN 4.3 02/21/2020 1411   AST 20 02/21/2020 1411   ALT 18 02/21/2020 1411   ALKPHOS 60 02/21/2020 1411   BILITOT 0.4 02/21/2020 1411   GFRNONAA >60 02/21/2020 1411          RADIOGRAPHIC STUDIES:  NM PET Image Initial (PI) Skull Base To Thigh  Result Date: 03/04/2020 CLINICAL DATA:  Subsequent treatment strategy for breast carcinoma. EXAM: NUCLEAR MEDICINE PET SKULL BASE TO THIGH TECHNIQUE: 7.5 mCi F-18 FDG was injected intravenously. Full-ring PET imaging was performed from the skull base to thigh after  the radiotracer. CT data was obtained and used for attenuation correction and anatomic localization. Fasting blood glucose: 90 mg/dl COMPARISON:  None. FINDINGS: Mediastinal blood pool activity: SUV max 2.4 Liver activity: SUV max 3.4 NECK: No hypermetabolic lymph nodes in the neck. Incidental CT findings: none CHEST: Hypermetabolic mass in the medial RIGHT chest wall adjacent to the sternum between the second and third rib measures 4.1 cm. Chest wall mass extends into the LEFT upper lobe and elevates the pectoralis muscle. Mass is intensely hypermetabolic with SUV max equal 9.1. Three adjacent round nodules in the superior segment of the LEFT lower lobe. The largest nodule measures 7 mm image 73. This nodule does have faint radiotracer activity with SUV max equal 2.1. The several similar nodules in the LEFT upper lobe. For example 5 mm nodule image 62/4 the smaller nodules do not have radiotracer activity. Round nodule in the RIGHT lung middle lobe on image 92 measures 5 mm. Several smaller nodules in the RIGHT lower lobe on image 77. Incidental CT findings: No hypermetabolic mediastinal lymph nodes. ABDOMEN/PELVIS: 2 low-density lesions in the liver and I have radiotracer activity consistent benign lesions. Normal adrenal glands. No hypermetabolic abdominopelvic lymph nodes. Incidental CT findings: Post hysterectomy.  Adnexa unremarkable  SKELETON: There is metabolic activity associated with two healed RIGHT posterolateral ribs on image 88 and 81. Incidental CT findings: None IMPRESSION: 1. Hypermetabolic RIGHT chest wall mass consistent with breast carcinoma. 2. Several round pulmonary nodules within the LEFT lung and to lesser degree the RIGHT lung. One larger nodule does have radiotracer activity. Findings are most consistent with pulmonary metastasis. 3. No evidence skeletal metastasis. Metabolic activity associated with healed RIGHT rib fractures are favored posttraumatic. Recommend clinical correlation. Electronically Signed   By: Suzy Bouchard M.D.   On: 03/04/2020 10:39         ASSESSMENT and PLAN:   Metastatic breast cancer ([place]) Right breast cancer in 2010 for which she underwent a right mastectomy with reconstruction in 2011.  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   Recurrence: She recently palpated a left chest wall mass for about 6 weeks.  Chest CT on 02/11/20 showed left anterior chest mass 6.2 cm.  Multiple bilateral pulmonary nodules consistent with metastatic disease.  Biopsy shows invasive ductal carcinoma, ER positive, with Ki-67 of 1%  PET scan on 03/04/2020 shows hypermetabolic activity on the lungs consistent with metastases, however less than 53m   Merdis has metastatic breast cancer.  I wrote her a letter to this effect.  I reviewed with her that she would  Likely benefit from additional genetic testing since her last genetic testing was in 2011.  Will send her path for caris testing and I placed a referral for her to see radiation oncology.    DSerahwill return next week to review results with Dr. GLindi Adie get his opinion on her lung lesions.  She will continue Anastrozole in the meantime.       Orders Placed This  Encounter  Procedures  . Ambulatory referral to Genetics    Referral Priority:   Routine    Referral Type:   Consultation    Referral Reason:   Specialty Services Required    Number of Visits Requested:   1  . Ambulatory referral to Radiation Oncology    Referral Priority:   Routine  Referral Type:   Consultation    Referral Reason:   Specialty Services Required    Requested Specialty:   Radiation Oncology    Number of Visits Requested:   1    All questions were answered. The patient knows to call the clinic with any problems, questions or concerns. We can certainly see the patient much sooner if necessary.  Total encounter time: 30 minutes*  Wilber Bihari, NP 03/05/20 3:54 PM Medical Oncology and Hematology [facility] [place], [place] [address] Tel. [phone]    Fax. [phone]  *Total Encounter Time as defined by the Centers for Medicare and Medicaid Services includes, in addition to the face-to-face time of a patient visit (documented in the note above) non-face-to-face time: obtaining and reviewing outside history, ordering and reviewing medications, tests or procedures, care coordination (communications with other health care professionals or caregivers) and documentation in the medical record.

## 2020-03-04 NOTE — Assessment & Plan Note (Addendum)
Right breast cancer in 2010 for which she underwent a right mastectomy with reconstruction in 2011.  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   Recurrence: She recently palpated a left chest wall mass for about 6 weeks.  Chest CT on 02/11/20 showed left anterior chest mass 6.2 cm.  Multiple bilateral pulmonary nodules consistent with metastatic disease.  Biopsy shows invasive ductal carcinoma, ER positive, with Ki-67 of 1%  PET scan on 03/04/2020 shows hypermetabolic activity on the lungs consistent with metastases, however less than 2m   Melanie Davis has metastatic breast cancer.  I wrote her a letter to this effect.  I reviewed with her that she would  Likely benefit from additional genetic testing since her last genetic testing was in 2011.  Will send her path for caris testing and I placed a referral for her to see radiation oncology.    DTimikawill return next week to review results with Dr. GLindi Adie get his opinion on her lung lesions.  She will continue Anastrozole in the meantime.

## 2020-03-05 ENCOUNTER — Inpatient Hospital Stay: Payer: BC Managed Care – PPO | Attending: Adult Health | Admitting: Adult Health

## 2020-03-05 ENCOUNTER — Telehealth: Payer: Self-pay | Admitting: Adult Health

## 2020-03-05 ENCOUNTER — Encounter: Payer: Self-pay | Admitting: Adult Health

## 2020-03-05 ENCOUNTER — Other Ambulatory Visit: Payer: Self-pay

## 2020-03-05 VITALS — BP 145/70 | HR 89 | Temp 97.5°F | Resp 20 | Ht 66.0 in | Wt 157.1 lb

## 2020-03-05 DIAGNOSIS — Z853 Personal history of malignant neoplasm of breast: Secondary | ICD-10-CM | POA: Insufficient documentation

## 2020-03-05 DIAGNOSIS — C7801 Secondary malignant neoplasm of right lung: Secondary | ICD-10-CM | POA: Insufficient documentation

## 2020-03-05 DIAGNOSIS — Z170 Estrogen receptor positive status [ER+]: Secondary | ICD-10-CM | POA: Diagnosis not present

## 2020-03-05 DIAGNOSIS — C7802 Secondary malignant neoplasm of left lung: Secondary | ICD-10-CM | POA: Diagnosis not present

## 2020-03-05 DIAGNOSIS — C50812 Malignant neoplasm of overlapping sites of left female breast: Secondary | ICD-10-CM | POA: Diagnosis not present

## 2020-03-05 DIAGNOSIS — C50919 Malignant neoplasm of unspecified site of unspecified female breast: Secondary | ICD-10-CM

## 2020-03-05 DIAGNOSIS — Z7722 Contact with and (suspected) exposure to environmental tobacco smoke (acute) (chronic): Secondary | ICD-10-CM | POA: Insufficient documentation

## 2020-03-05 NOTE — Telephone Encounter (Signed)
Scheduled appts per 2/3 los. Pt stated she would refer to mychart for AVS.

## 2020-03-06 ENCOUNTER — Telehealth: Payer: Self-pay | Admitting: Hematology and Oncology

## 2020-03-06 NOTE — Telephone Encounter (Signed)
Scheduled appt per 2/3 sch msg - pt is aware of appt date and time

## 2020-03-09 ENCOUNTER — Encounter: Payer: Self-pay | Admitting: *Deleted

## 2020-03-09 NOTE — Progress Notes (Signed)
Per request of Wilber Bihari, NP Caris request successfully faxed.

## 2020-03-09 NOTE — Progress Notes (Incomplete)
Patient Care Team: Ephriam Jenkins, PA as PCP - General (Physician Assistant)  DIAGNOSIS: No diagnosis found.  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 CT chest 02/10/2018 [facility]: Heterogeneous mass left anterior chest bordering the sternum 6.2 x 4 x 4.2 cm.  Multiple bilateral lung nodules LUL: 5 mm, LLL: 7 mm, 2 adjacent nodules LLL: 5 mm, LUL lingula: 6 mm, RML: 5 mm, hazy linear reticulonodular opacities, 4 low-density liver masses 3 cm likely cysts     CHIEF COMPLIANT: Follow-up of metastatic breast cancer to discuss treatment plan  INTERVAL HISTORY: Melanie Davis is a 60 y.o. with above-mentioned history of metastatic breast cancer currently on anastrozole. Biopsy of the chest wall mass on 02/28/20 showed invasive ductal carcinoma, ER+ >95%, PR+ 1% weak, HER-2 negative (1+). PET scan on 03/04/20 showed a right chest wall mass, several pulmonary nodules consistent with metastases, and no evidence of osseous metastases. She presents to the clinic today to discuss treatment options.   ALLERGIES:  has No Known Allergies.  MEDICATIONS:  Current Outpatient Medications  Medication Sig Dispense Refill  . albuterol (VENTOLIN HFA) 108 (90 Base) MCG/ACT inhaler Inhale 1 puff into the lungs every 4 (four) hours as needed for wheezing or shortness of breath.    . anastrozole (ARIMIDEX) 1 MG tablet Take 1 tablet (1 mg total) by mouth daily. 90 tablet 3  . beclomethasone (QVAR) 40  MCG/ACT inhaler Inhale into the lungs 2 (two) times daily.    . fluticasone (FLONASE) 50 MCG/ACT nasal spray Place 1 spray into both nostrils daily as needed for allergies or rhinitis.    Marland Kitchen levothyroxine (SYNTHROID) 100 MCG tablet Take 100 mcg by mouth daily before breakfast.    . lisinopril (ZESTRIL) 5 MG tablet Take 5 mg by mouth daily.    Marland Kitchen triamcinolone ointment (KENALOG) 0.5 % Apply 1 application topically 2 (two) times daily. 30 g 0   No current facility-administered medications for this visit.    PHYSICAL EXAMINATION: ECOG PERFORMANCE STATUS: {CHL ONC ECOG [phone]}  There were no vitals filed for this visit. There were no vitals filed for this visit.   LABORATORY DATA:  I have reviewed the data as listed CMP Latest Ref Rng & Units 02/21/2020  Glucose 70 - 99 mg/dL 99  BUN 6 - 20 mg/dL 13  Creatinine 0.44 - 1.00 mg/dL 0.87  Sodium 135 - 145 mmol/L 142  Potassium 3.5 - 5.1 mmol/L 4.3  Chloride 98 - 111 mmol/L 107  CO2 22 - 32 mmol/L 26  Calcium 8.9 - 10.3 mg/dL 9.4  Total Protein 6.5 - 8.1 g/dL 7.2  Total Bilirubin 0.3 - 1.2 mg/dL 0.4  Alkaline Phos 38 - 126 U/L 60  AST 15 - 41 U/L 20  ALT 0 - 44 U/L 18    Lab Results  Component Value Date   WBC 5.5 02/28/2020   HGB 14.8 02/28/2020   HCT 44.4 02/28/2020   MCV 96.9 02/28/2020   PLT 237 02/28/2020   NEUTROABS 3.8 02/21/2020  ASSESSMENT & PLAN:  No problem-specific Assessment & Plan notes found for this encounter.    No orders of the defined types were placed in this encounter.  The patient has a good understanding of the overall plan. she agrees with it. she will call with any problems that may develop before the next visit here.  Total time spent: *** mins including face to face time and time spent for planning, charting and coordination of care  Rulon Eisenmenger, MD, MPH 03/09/2020  I, Melanie Davis, am acting as scribe for Dr. Nicholas Lose.  {insert scribe attestation}

## 2020-03-10 ENCOUNTER — Telehealth: Payer: Self-pay | Admitting: Pharmacist

## 2020-03-10 ENCOUNTER — Telehealth: Payer: BC Managed Care – PPO | Admitting: Hematology and Oncology

## 2020-03-10 ENCOUNTER — Encounter: Payer: Self-pay | Admitting: Genetic Counselor

## 2020-03-10 ENCOUNTER — Inpatient Hospital Stay (HOSPITAL_BASED_OUTPATIENT_CLINIC_OR_DEPARTMENT_OTHER): Payer: BC Managed Care – PPO | Admitting: Genetic Counselor

## 2020-03-10 ENCOUNTER — Telehealth: Payer: Self-pay

## 2020-03-10 ENCOUNTER — Inpatient Hospital Stay: Payer: BC Managed Care – PPO | Admitting: Hematology and Oncology

## 2020-03-10 ENCOUNTER — Other Ambulatory Visit: Payer: Self-pay

## 2020-03-10 ENCOUNTER — Other Ambulatory Visit: Payer: Self-pay | Admitting: Genetic Counselor

## 2020-03-10 ENCOUNTER — Inpatient Hospital Stay: Payer: BC Managed Care – PPO

## 2020-03-10 DIAGNOSIS — C50919 Malignant neoplasm of unspecified site of unspecified female breast: Secondary | ICD-10-CM

## 2020-03-10 DIAGNOSIS — Z801 Family history of malignant neoplasm of trachea, bronchus and lung: Secondary | ICD-10-CM | POA: Insufficient documentation

## 2020-03-10 DIAGNOSIS — Z803 Family history of malignant neoplasm of breast: Secondary | ICD-10-CM | POA: Insufficient documentation

## 2020-03-10 MED ORDER — PALBOCICLIB 125 MG PO TABS: 125.0000 mg | ORAL_TABLET | Freq: Every day | ORAL | 6 refills | Status: DC

## 2020-03-10 MED ORDER — PALBOCICLIB 125 MG PO CAPS: 125.0000 mg | ORAL_CAPSULE | Freq: Every day | ORAL | 6 refills | Status: DC

## 2020-03-10 NOTE — Telephone Encounter (Signed)
Oral Oncology Pharmacist Encounter  Received new prescription for Ibrance (palbociclib) for the treatment of metastatic, HR positive, HER-2 negative breast cancer in conjunction with anastrozole, planned duration until disease progression or unacceptable drug toxicity.  Prescription dose and frequency assessed for appropriateness. Appropriate for therapy initiation.   CBC w/ Diff and CMP from 02/21/20 assessed, overall labs stable and WNL.  Current medication list in Epic reviewed, no relevant/significant DDIs with Ibrance identified.  Evaluated chart and no patient barriers to medication adherence noted.   Prescription has been e-scribed to the [facility] for benefits analysis and approval.  Oral Oncology Clinic will continue to follow for insurance authorization, copayment issues, initial counseling and start date.  Leron Croak, PharmD, BCPS Hematology/Oncology Clinical Pharmacist [facility] [phone] 03/10/2020 4:33 PM

## 2020-03-10 NOTE — Progress Notes (Signed)
REFERRING PROVIDER: Gardenia Phlegm, NP [place],  [place] [address]  PRIMARY PROVIDER:  Benjiman Core A, PA  PRIMARY REASON FOR VISIT:  1. Metastatic breast cancer ([place])   2. Family history of breast cancer   3. Family history of lung cancer       HISTORY OF PRESENT ILLNESS:   Melanie Davis, a 60 y.o. female, was seen for a [facility] cancer genetics consultation at the request of Wilber Bihari, NP, due to a personal and family history of cancer.  Melanie Davis presents to clinic today to discuss the possibility of a hereditary predisposition to cancer, genetic testing, and to further clarify her future cancer risks, as well as potential cancer risks for family members.   In 2010, Melanie Davis was diagnosed with ductal carcinoma in situ of the right breast. This cancer was treated with a right mastectomy. Then, in 2016, Melanie Davis was diagnosed with invasive ductal carcinoma of the left breast. The tumor was ER+/PR+/Her2-. This cancer was treated with a left mastectomy.   Melanie Davis previously underwent genetic testing in 2011 of the BRCA1 and BRCA2 genes through [facility]'s Comprehensive BRACAnalysis panel. This testing was negative.    CANCER HISTORY:  Oncology History  Metastatic breast cancer ([place])  2010 Initial Biopsy   Right breast DCIS: Right mastectomy with reconstruction in 2011 in [place], did not take antiestrogen therapy   2016 Initial Biopsy   Left breast cancer: Left mastectomy with reconstruction 12/04/2014: Pathology revealed grade 2 IDC ER 75%, PR 75%, HER2 negative, margins clear, 0/2 lymph nodes negative, treated at [facility], patient was not prescribed antiestrogen treatment    Relapse/Recurrence   Palpable left breast mass December 2021. CT chest 02/10/2018 [facility]: Heterogeneous mass left anterior chest bordering the sternum 6.2 x 4 x 4.2 cm.  Multiple bilateral lung nodules LUL: 5 mm, LLL: 7 mm, 2 adjacent nodules LLL: 5  mm, LUL lingula: 6 mm, RML: 5 mm, hazy linear reticulonodular opacities, 4 low-density liver masses 3 cm likely cysts     RISK FACTORS:  Menarche was at age 60.  No live births.  OCP use for approximately 27 years.  Ovaries intact: no, left ovary removed, right ovary intact.  Hysterectomy: yes.  Menopausal status: postmenopausal.  HRT use: 0 years. Colonoscopy: yes; 2016. Mammogram within the last year: n/a. Any excessive radiation exposure in the past: no.   Past Medical History:  Diagnosis Date   Asthma    Cancer ([place])    Family history of breast cancer    Family history of lung cancer     Past Surgical History:  Procedure Laterality Date   BREAST SURGERY     THYROID SURGERY      Social History   Socioeconomic History   Marital status: Unknown    Spouse name: Not on file   Number of children: Not on file   Years of education: Not on file   Highest education level: Not on file  Occupational History   Not on file  Tobacco Use   Smoking status: Never Smoker   Smokeless tobacco: Never Used  Vaping Use   Vaping Use: Never used  Substance and Sexual Activity   Alcohol use: Yes    Comment: social   Drug use: Never   Sexual activity: Not on file  Other Topics Concern   Not on file  Social History Narrative   Not on file   Social Determinants of Health   Financial  Resource Strain: Not on file  Food Insecurity: Not on file  Transportation Needs: Not on file  Physical Activity: Not on file  Stress: Not on file  Social Connections: Not on file     FAMILY HISTORY:  We obtained a detailed, 4-generation family history.  Significant diagnoses are listed below: Family History  Problem Relation Age of Onset   Stroke Mother    Lung cancer Mother 58       smoker   Diabetes Mother    COPD Mother    Alcoholism Mother    Pancreatitis Father    Renal Disease Father    Hypertension Father    Obesity Father    Breast cancer Sister         dx early 80s   Stroke Sister 42   Macular degeneration Brother    Hypertension Brother    Breast cancer Maternal Aunt        dx unknown age   Intracerebral hemorrhage Maternal Grandfather    Breast cancer Cousin 57   Cancer Cousin        Anal cancer, dx 57s   Melanie Davis does not have children. She has one brother (age 75) and had one sister who died at age 2 from a stroke and had a history of breast cancer diagnosed in her early 73s.  Melanie Davis mother died at age 52 and had a history of lung cancer (diagnosed at age 52). There were two maternal aunts and three maternal uncles. One aunt died at age 109 from breast cancer (unknown age of diagnosis). There is no known cancer among maternal cousins. Melanie Davis maternal grandmother died at age 67 without cancer. Her maternal grandfather died at age 69 without cancer.   Melanie Davis father died at age 73 without cancer. There were two paternal aunts and three paternal uncles. There is no known cancer among paternal aunts/uncles or paternal cousins. Ms. Gangi paternal grandmother died at age 70 (cause of death is unknown). Her paternal grandfather died at age 62 (cause of death is unknown).   Melanie Davis is unaware of previous family history of genetic testing for hereditary cancer risks. Patient's maternal ancestors are of [place] descent, and paternal ancestors are of English descent. There is no reported Ashkenazi Jewish ancestry. There is no known consanguinity.  GENETIC COUNSELING ASSESSMENT: Melanie Davis is a 60 y.o. female with a personal history of bilateral breast cancer and a family history of breast cancer, which is somewhat suggestive of a hereditary cancer syndrome and predisposition to cancer. We, therefore, discussed and recommended the following at today's visit.   DISCUSSION: We discussed that approximately 5-10% of breast cancer is hereditary, with most cases associated with the BRCA1 and BRCA2 genes. There are  other genes that can be associated with hereditary breast cancer syndromes. These include ATM, CHEK2, PALB2, etc. We discussed that testing is beneficial for several reasons, including identifying whether targeted treatment options such as PARP inhibitors would be beneficial, knowing about other cancer risks, identifying potential screening and risk-reduction options that may be appropriate, and to understand if other family members could be at risk for cancer and allow them to undergo genetic testing.  Melanie Davis had normal genetic testing of the BRCA1/2 genes in 2011 (result reviewed). Of note, this testing did not include large genomic rearrangement analysis of the BRCA genes and was thus incomplete. Additionally, there are other genes that are known to increase breast cancer risk and mutations in these genes may impact  medical management. Therefore, it is appropriate to pursue updated genetic testing for other hereditary breast cancer genes.    We reviewed the characteristics, features and inheritance patterns of hereditary cancer syndromes. We also discussed genetic testing, including the appropriate family members to test, the process of testing, insurance coverage and turn-around-time for results. We discussed the implications of a negative vs a positive result.   Melanie Davis expressed that she would only want genetic testing for genes that would inform her treatment options. Therefore, we recommended Melanie Davis pursue genetic testing for a customized panel of genes that are known to be involved in homologous recombination repair, and therefore may indicate benefit from PARP inhibitor treatment (either through FDA-approved therapies or clinical trials). Appropriate genes to test may include:  ATM, ATR, BARD1, BRCA1, BRCA2, BRIP1, CHEK2, FANCA, FANCC, FANCD2, FANCE, FANCF, FANCL, FANCM, MRE11A, NBN, PALB2, RAD50, RAD51C, and RAD51D.  Based on Melanie Davis's personal and family history of cancer, she meets  medical criteria for genetic testing. Despite that she meets criteria, there may still be an out of pocket cost.   PLAN:  Melanie Davis did not wish to pursue genetic testing at today's visit, and would like to discuss her treatment options and the utility of genetic testing with Dr. Lindi Adie before making a decision. We understand this decision and remain available to coordinate genetic testing at any time in the future. We, therefore, recommend Melanie Davis continue to follow the cancer screening guidelines given by her primary healthcare provider.  Melanie Davis questions were answered to her satisfaction today. Our contact information was provided should additional questions or concerns arise. Thank you for the referral and allowing [place] to share in the care of your patient.   Clint Guy, [place], [facility] Licensed, Certified Dispensing [email or web address] Phone: [phone]  The patient was seen for a total of 35 minutes in face-to-face genetic counseling.  This patient was discussed with Drs. Magrinat, Lindi Adie and/or Burr Medico who agrees with the above.    _______________________________________________________________________ For Office Staff:  Number of people involved in session: 1 Was an Intern/ student involved with case: no

## 2020-03-10 NOTE — Telephone Encounter (Signed)
Oral Oncology Patient Advocate Encounter  Received notification from [place] that prior authorization for Melanie Davis is required.  PA submitted on CoverMyMeds Key [id number] Status is pending  Oral Oncology Clinic will continue to follow.  [name] Patient [name] Phone [phone] Fax [phone] 03/10/2020 4:29 PM

## 2020-03-10 NOTE — Progress Notes (Signed)
Patient Care Team: Ephriam Jenkins, PA as PCP - General (Physician Assistant)  DIAGNOSIS:  Encounter Diagnosis  Name Primary?  . Metastatic breast cancer ([place]) Yes    SUMMARY OF ONCOLOGIC HISTORY: Oncology History  Metastatic breast cancer ([place])  2010 Initial Biopsy   Right breast DCIS: Right mastectomy with reconstruction in 2011 in [place], did not take antiestrogen therapy   2016 Initial Biopsy   Left breast cancer: Left mastectomy with reconstruction 12/04/2014: Pathology revealed grade 2 IDC ER 75%, PR 75%, HER2 negative, margins clear, 0/2 lymph nodes negative, treated at [facility], patient was not prescribed antiestrogen treatment    Relapse/Recurrence   Palpable left breast mass December 2021. CT chest 02/10/2018 [facility]: Heterogeneous mass left anterior chest bordering the sternum 6.2 x 4 x 4.2 cm.  Multiple bilateral lung nodules LUL: 5 mm, LLL: 7 mm, 2 adjacent nodules LLL: 5 mm, LUL lingula: 6 mm, RML: 5 mm, hazy linear reticulonodular opacities, 4 low-density liver masses 3 cm likely cysts     CHIEF COMPLIANT:   INTERVAL HISTORY: Melanie Davis is a 60 year old with newly diagnosed Met Breast cancer who underwent a PET CT and biopsy and is here to discuss the results. Path came back as ER > 95% and Her 2 Neg with Ki 1%. She has bilateral lung nodules that are too small to characterize and the largest one had a SUV of 2. She had chronic second hand smoke exposure growing up. Theres sa rib lesion which is related to a fall and trauma. Shes tolerating anastrozole very well.   ALLERGIES:  has No Known Allergies.  MEDICATIONS:  Current Outpatient Medications  Medication Sig Dispense Refill  . palbociclib (IBRANCE) 125 MG capsule Take 1 capsule (125 mg total) by mouth daily with breakfast. Take whole with food. Take for 21 days on, 7 days off, repeat every 28 days. 21 capsule 6  . albuterol (VENTOLIN HFA) 108 (90 Base) MCG/ACT inhaler Inhale 1 puff into  the lungs every 4 (four) hours as needed for wheezing or shortness of breath.    . anastrozole (ARIMIDEX) 1 MG tablet Take 1 tablet (1 mg total) by mouth daily. 90 tablet 3  . beclomethasone (QVAR) 40 MCG/ACT inhaler Inhale into the lungs 2 (two) times daily.    . fluticasone (FLONASE) 50 MCG/ACT nasal spray Place 1 spray into both nostrils daily as needed for allergies or rhinitis.    Marland Kitchen levothyroxine (SYNTHROID) 100 MCG tablet Take 100 mcg by mouth daily before breakfast.    . lisinopril (ZESTRIL) 5 MG tablet Take 5 mg by mouth daily.    Marland Kitchen triamcinolone ointment (KENALOG) 0.5 % Apply 1 application topically 2 (two) times daily. 30 g 0   No current facility-administered medications for this visit.    PHYSICAL EXAMINATION: ECOG PERFORMANCE STATUS: 0 - Asymptomatic  There were no vitals filed for this visit. There were no vitals filed for this visit.  LABORATORY DATA:  I have reviewed the data as listed CMP Latest Ref Rng & Units 02/21/2020  Glucose 70 - 99 mg/dL 99  BUN 6 - 20 mg/dL 13  Creatinine 0.44 - 1.00 mg/dL 0.87  Sodium 135 - 145 mmol/L 142  Potassium 3.5 - 5.1 mmol/L 4.3  Chloride 98 - 111 mmol/L 107  CO2 22 - 32 mmol/L 26  Calcium 8.9 - 10.3 mg/dL 9.4  Total Protein 6.5 - 8.1 g/dL 7.2  Total Bilirubin 0.3 - 1.2 mg/dL 0.4  Alkaline Phos 38 -  126 U/L 60  AST 15 - 41 U/L 20  ALT 0 - 44 U/L 18    Lab Results  Component Value Date   WBC 5.5 02/28/2020   HGB 14.8 02/28/2020   HCT 44.4 02/28/2020   MCV 96.9 02/28/2020   PLT 237 02/28/2020   NEUTROABS 3.8 02/21/2020    ASSESSMENT & PLAN:  Metastatic breast cancer ([place]) Right breast cancer in 2010 for which she underwent a right mastectomy with reconstruction in 2011.  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   Recurrence: Chest CT on  02/11/20 showed left anterior chest mass 6.2 cm.  Multiple bilateral pulmonary nodules consistent with metastatic disease.  Biopsy shows invasive ductal carcinoma, ER positive, with Ki-67 of 1%  PET scan on 03/04/2020 shows hypermetabolic activity on the lungs consistent with metastases, however less than 58m   Plan: 1. Add Ibrance to Anastrozole 2. Genetic testing 3. [facility] Genomic testing 4. Rad onc consult: if XRT is indicated, ILeslee Homemight be held briefly. Since ses asymptomatic, we might hold off on doing XRT right away  New chest wall nodule: on the right side  Ibrance: I discussed the risks and benefits of Ibrance including myelosuppression especially neutropenia and with that risk of infection, there is risk of pulmonary embolism and mild peripheral neuropathy as well. Fatigue, nausea, diarrhea, decreased appetite as well as alopecia and thrombocytopenia are also potential side effects of Ibrance  Check labs in 3 weeks and follow up.     Orders Placed This Encounter  Procedures  . CBC with Differential (Cancer Center Only)    Standing Status:   Future    Standing Expiration Date:   03/10/2021  . C[place] ([facility] [facility])    Standing Status:   Future    Standing Expiration Date:   03/10/2021   The patient has a good understanding of the overall plan. she agrees with it. she will call with any problems that may develop before the next visit here. Total time spent: 30 mins including face to face time and time spent for planning, charting and co-ordination of care   VHarriette Ohara MD 03/10/20

## 2020-03-10 NOTE — Assessment & Plan Note (Deleted)
Right breast cancer in 2010 for which she underwent a right mastectomy with reconstruction in 2011.  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   Recurrence: Chest CT on 02/11/20 showed left anterior chest mass 6.2 cm.  Multiple bilateral pulmonary nodules consistent with metastatic disease.  Biopsy shows invasive ductal carcinoma, ER positive, with Ki-67 of 1%  PET scan on 03/04/2020 shows hypermetabolic activity on the lungs consistent with metastases, however less than 1m   Plan: 1. Add Ibrance to Anastrozole 2. Genetic testing 3. Caris Genomic testing 4. Rad onc consult: if XRT is indicated, ILeslee Homemight be held briefly.  Ibrance: I discussed the risks and benefits of Ibrance including myelosuppression especially neutropenia and with that risk of infection, there is risk of pulmonary embolism and mild peripheral neuropathy as well. Fatigue, nausea, diarrhea, decreased appetite as well as alopecia and thrombocytopenia are also potential side effects of Ibrance  Check labs in 2 weeks and follow up.

## 2020-03-10 NOTE — Assessment & Plan Note (Addendum)
Right breast cancer in 2010 for which she underwent a right mastectomy with reconstruction in 2011.  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   Recurrence: Chest CT on 02/11/20 showed left anterior chest mass 6.2 cm.  Multiple bilateral pulmonary nodules consistent with metastatic disease.  Biopsy shows invasive ductal carcinoma, ER positive, with Ki-67 of 1%  PET scan on 03/04/2020 shows hypermetabolic activity on the lungs consistent with metastases, however less than 11m   Plan: 1. Add Ibrance to Anastrozole 2. Genetic testing 3. Caris Genomic testing 4. Rad onc consult: if XRT is indicated, ILeslee Homemight be held briefly. Since ses asymptomatic, we might hold off on doing XRT right away  New chest wall nodule: on the right side  Ibrance: I discussed the risks and benefits of Ibrance including myelosuppression especially neutropenia and with that risk of infection, there is risk of pulmonary embolism and mild peripheral neuropathy as well. Fatigue, nausea, diarrhea, decreased appetite as well as alopecia and thrombocytopenia are also potential side effects of Ibrance  Check labs in 3 weeks and follow up.

## 2020-03-11 ENCOUNTER — Telehealth: Payer: Self-pay

## 2020-03-11 MED FILL — IBRANCE 125 MG TABS: 125 | 28 days supply | Qty: 21 | Fill #0

## 2020-03-11 NOTE — Telephone Encounter (Signed)
Oral Chemotherapy Pharmacist Encounter  I spoke with patient for overview of: Ibrance for the treatment of metastatic, hormone-receptor positive, HER2 receptor negative breast cancer, in combination with anastrozole, planned duration until disease progression or unacceptable toxicity.   Counseled patient on administration, dosing, side effects, monitoring, drug-food interactions, safe handling, storage, and disposal.  Patient will take Ibrance 175m tablets, 1 tablet by mouth once daily, with or without food, taken for 3 weeks on, 1 week off, and repeated.  Patient knows to avoid grapefruit and grapefruit juice while on treatment with Ibrance.  Ibrance start date: 03/11/20  Adverse effects include but are not limited to: fatigue, hair loss, GI upset, nausea, decreased blood counts, and increased upper respiratory infections. Severe, life-threatening, and/or fatal interstitial lung disease (ILD) and/or pneumonitis may occur with CDK 4/6 inhibitors.  Patient will obtain anti diarrheal and alert the office of 4 or more loose stools above baseline.  Patient reminded of WBC check on Cycle 1 Day 14 for dose and ANC assessment.  Reviewed with patient importance of keeping a medication schedule and plan for any missed doses. No barriers to medication adherence identified.  Medication reconciliation performed and medication/allergy list updated.  Insurance authorization for ILeslee Homehas been obtained. Test claim at the pharmacy revealed copayment $250 for 1st fill of Ibrance. Copay card obtained for patient bringing out of pocket cost to $0. [place] [facility] [place] [facility]  Patient informed the pharmacy will reach out 5-7 days prior to needing next fill of Ibrance to coordinate continued medication acquisition to prevent break in therapy.  All questions answered.  Ms. SPaquettevoiced understanding and appreciation.   Medication education handout  placed in mail for patient. Patient knows to call the office with questions or concerns. Oral Chemotherapy Clinic phone number provided to patient.   RLeron Croak PharmD, BCPS Hematology/Oncology Clinical Pharmacist [phone]/10/2020 9:36 AM

## 2020-03-11 NOTE — Telephone Encounter (Signed)
Oral Oncology Patient Advocate Encounter  Prior Authorization for Melanie Davis has been approved.    PA# [id number] Effective dates: 03/10/20 through 03/10/21  Patients co-pay is $250  Oral Oncology Clinic will continue to follow.    [place] Patient [place] Phone [phone] Fax [phone] 03/11/2020 8:06 AM

## 2020-03-11 NOTE — Telephone Encounter (Signed)
Obtained an [place] copay card from [employer]. With this card, eligible patients will pay $0 a month with [facility] covering copay up to $25000 per calendar year. BIN [id number] GRP [id number] ID [id number]  EXP 01/30/21  [place] Patient [place] Phone [phone] Fax [phone] 03/11/2020 8:24 AM

## 2020-03-11 NOTE — Progress Notes (Signed)
New Breast Cancer Diagnosis: Left Anterior chest mass- recurrent breast cancer  Did patient present with symptoms (if so, please note symptoms) or screening mammography?:Palpable mass noted in late December 2021.  PET 02/07/15: Hypermetabolic RIGHT chest wall mass consistent with breast Carcinoma.  Several round pulmonary nodules within the LEFT lung and to lesser degree the RIGHT lung. One larger nodule does have radiotracer activity. Findings are most consistent with pulmonary metastasis.  No evidence skeletal metastasis.   CT Chest 02/13/2020: 6.2 x 4.0 x 4.2 cm mass that involves the anterior left upper lobe and chest wall. An origin from the chest wall is suspected supported by the internal mammary vessels traveling through the mass. The mass does not erode the contiguous left sternum or adjacent anterior second and third left rib cartilages. Metastatic disease from breast carcinoma is suspected, possibly arising along the left internal mammary chain.  Multiple bilateral lung nodules as described consistent with pulmonary metastatic disease.   Histology per Pathology Report: Invasive Ductal Carcinoma  Receptor Status: ER(positive>95%), PR (positive weak staining), Her2-neu (negative), Ki-(1%)  Surgeon and surgical plan, if any: -Right Mastectomy with reconstruction in 2011, treated in [facility], did not take antiestrogen therapy, DCIS.  -Left breast cancer in 2016 and underwent a left mastectomy with reconstruction on 12/04/2014 invasive ductal carcinoma, grade 2, ER+ 75%, PR+ 75%, HER-2 negative (1+), clear margins, 2 left axillary lymph nodes negative.     Medical oncologist, treatment if any:   Dr. Lindi Adie 03/10/2020 -She has bilateral lung nodules that are too small to characterize and the largest one had a SUV of 2. -Add Ibrance to Anastrozole -Genetic testing -[facility] Genomic testing -Rad onc consult: if XRT is indicated, Leslee Home might be held briefly. Since shes asymptomatic,  we might hold off on doing XRT right away   Lymphedema issues, if any:  No  Pain issues, if any:  No pain associated with the area in her chest.  She is able to feel it, feels sensitive.  SAFETY ISSUES: Prior radiation? No Pacemaker/ICD? No Possible current pregnancy? Hysterectomy Is the patient on methotrexate? No  Current Complaints / other details:   -Thyroidectomy -No genetic testing this time.  Had it done in 2011.

## 2020-03-12 ENCOUNTER — Ambulatory Visit
Admission: RE | Admit: 2020-03-12 | Discharge: 2020-03-12 | Disposition: A | Discharge status: Home / Self Care | Payer: BC Managed Care – PPO | Source: Ambulatory Visit | Attending: Radiation Oncology | Admitting: Radiation Oncology

## 2020-03-12 ENCOUNTER — Encounter: Payer: Self-pay | Admitting: Radiation Oncology

## 2020-03-12 ENCOUNTER — Other Ambulatory Visit: Payer: Self-pay

## 2020-03-12 VITALS — BP 137/73 | HR 95 | Temp 97.5°F | Resp 18 | Ht 66.0 in | Wt 155.4 lb

## 2020-03-12 DIAGNOSIS — J45909 Unspecified asthma, uncomplicated: Secondary | ICD-10-CM | POA: Diagnosis not present

## 2020-03-12 DIAGNOSIS — Z803 Family history of malignant neoplasm of breast: Secondary | ICD-10-CM | POA: Diagnosis not present

## 2020-03-12 DIAGNOSIS — Z170 Estrogen receptor positive status [ER+]: Secondary | ICD-10-CM | POA: Diagnosis not present

## 2020-03-12 DIAGNOSIS — R918 Other nonspecific abnormal finding of lung field: Secondary | ICD-10-CM | POA: Diagnosis not present

## 2020-03-12 DIAGNOSIS — Z79899 Other long term (current) drug therapy: Secondary | ICD-10-CM | POA: Diagnosis not present

## 2020-03-12 DIAGNOSIS — C50919 Malignant neoplasm of unspecified site of unspecified female breast: Secondary | ICD-10-CM | POA: Diagnosis not present

## 2020-03-12 DIAGNOSIS — Z801 Family history of malignant neoplasm of trachea, bronchus and lung: Secondary | ICD-10-CM | POA: Insufficient documentation

## 2020-03-12 DIAGNOSIS — C792 Secondary malignant neoplasm of skin: Secondary | ICD-10-CM | POA: Insufficient documentation

## 2020-03-13 ENCOUNTER — Telehealth: Payer: Self-pay | Admitting: Licensed Clinical Social Worker

## 2020-03-13 NOTE — Telephone Encounter (Signed)
[place] Psychosocial Distress Screening Clinical Social Work  Clinical Social Work was referred by distress screening protocol.  The patient scored a 8 on the Psychosocial Distress Thermometer which indicates severe distress. Clinical Social Worker contacted patient by phone to assess for distress and other psychosocial needs in patient with new dx of metastatic cancer.  Patient answered but unable to talk at this time due to work meetings. Stated she will call back, likely on Monday. CSW gave direct contact information.  ONCBCN DISTRESS SCREENING 03/12/2020  Screening Type Initial Screening  Distress experienced in past week (1-10) 8  Practical problem type Work/school  Emotional problem type Depression;Nervousness/Anxiety;Adjusting to illness  Other Contact via phone        Pressley Tadesse E Sheronica Corey, LCSW

## 2020-03-16 ENCOUNTER — Telehealth: Payer: Self-pay | Admitting: Genetic Counselor

## 2020-03-16 NOTE — Telephone Encounter (Signed)
LVM following up on our discussion of genetic testing from last week. Left callback number.

## 2020-03-16 NOTE — Progress Notes (Cosign Needed Addendum)
Radiation Oncology         [phone] ________________________________  Name: Melanie Davis        MRN: [id number]  Date of Service: 03/12/2020 DOB: [birth year]  WU:[id number], Melanie Stakes, PA  Melanie Lose, MD     REFERRING PHYSICIAN: Nicholas Lose, MD   DIAGNOSIS: The encounter diagnosis was Metastatic breast cancer ([place]).   HISTORY OF PRESENT ILLNESS: Melanie Davis is a 60 y.o. female seen at the request of Dr. Ladona Davis for newly noted metastatic breast cancer.  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  She unfortunately developed a new cancer in the left breast in 2016 and underwent left mastectomy with reconstruction for a grade 2 invasive ductal carcinoma that was ER/PR positive HER-2 was negative, she had clear margins and no axillary adenopathy.  She did not receive any adjuvant radiotherapy.  She recently noted an area in the chest that was enlarging and to some degree uncomfortable.  A chest x-ray on 02/11/2020 revealed masslike opacity in the retrosternal region, and subsequent CT chest with contrast on 02/13/2020 revealed a mass measuring 6.2 cm in greatest dimension in the left anterior chest bordering the left margin of the sternum involving the chest wall and mildly displacing the overlying medial pectoralis muscle anteriorly with no resorption of the sternum where it abuts the mass or the associated rib cartilages intramammary vasculature coursed through the mass.  She also has bilateral numerous nodules in the chest the largest of which was 7 mm in the left lower lobe, there were 4 low-density liver masses the largest measuring 3 cm.  She underwent a CT biopsy on 02/28/2020 which showed invasive ductal carcinoma with extracellular mucin consistent with breast cancer primary.  A PET scan on 03/04/2020 showed hypermetabolic right chest mass and several rounded nodules in the left as  well as right lung the one nodule does have radiotracer activity of 2.1 and that was the 7 mm left lower lobe nodule.  She has also met with genetic counseling for testing.  She plans to continue antiestrogen and has added Ibrance to her regimen since meeting with Dr. Lindi Davis.  She is seen today to discuss options of radiotherapy and where it may fit in her treatment course.   PREVIOUS RADIATION THERAPY: No   PAST MEDICAL HISTORY:  Past Medical History:  Diagnosis Date  . Asthma   . Cancer ([place])   . Family history of breast cancer   . Family history of lung cancer        PAST SURGICAL HISTORY: Past Surgical History:  Procedure Laterality Date  . BREAST SURGERY    . THYROID SURGERY       FAMILY HISTORY:  Family History  Problem Relation Age of Onset  . Stroke Mother   . Lung cancer Mother 65       smoker  . Diabetes Mother   . COPD Mother   . Alcoholism Mother   . Pancreatitis Father   . Renal Disease Father   . Hypertension Father   . Obesity Father   . Breast cancer Sister        dx early 27s  . Stroke Sister 83  . Macular degeneration Brother   . Hypertension Brother   . Breast cancer Maternal Aunt        dx unknown age  . Intracerebral hemorrhage Maternal Grandfather   . Breast cancer Cousin 48  .  Cancer Cousin        Anal cancer, dx 30s     SOCIAL HISTORY:  reports that she has never smoked. She has never used smokeless tobacco. She reports current alcohol use. She reports that she does not use drugs.  The patient is married and accompanied by her husband.  She is the [place] at [place].   ALLERGIES: Patient has no known allergies.   MEDICATIONS:  Current Outpatient Medications  Medication Sig Dispense Refill  . albuterol (VENTOLIN HFA) 108 (90 Base) MCG/ACT inhaler Inhale 1 puff into the lungs every 4 (four) hours as needed for wheezing or shortness of breath.    . anastrozole (ARIMIDEX) 1 MG tablet Take 1 tablet (1 mg total) by mouth daily. 90 tablet 3   . beclomethasone (QVAR) 40 MCG/ACT inhaler Inhale into the lungs 2 (two) times daily.    . fluticasone (FLONASE) 50 MCG/ACT nasal spray Place 1 spray into both nostrils daily as needed for allergies or rhinitis.    Melanie Davis levothyroxine (SYNTHROID) 100 MCG tablet Take 100 mcg by mouth daily before breakfast.    . lisinopril (ZESTRIL) 5 MG tablet Take 5 mg by mouth daily.    . palbociclib (IBRANCE) 125 MG tablet Take 1 tablet (125 mg total) by mouth daily. Take for 21 days on, 7 days off, repeat every 28 days. 21 tablet 6  . triamcinolone ointment (KENALOG) 0.5 % Apply 1 application topically 2 (two) times daily. 30 g 0   No current facility-administered medications for this encounter.     REVIEW OF SYSTEMS: On review of systems, the patient reports that overall she is doing quite well.  She is not experiencing any shortness of breath or substernal shooting chest pain however feels that she can palpate the elevation that has been caused by the presence of this mass out more so along the left chest wall just lateral of her sternum.  She denies any fevers or chills or unintended weight changes.  No other complaints are verbalized.    PHYSICAL EXAM:  Wt Readings from Last 3 Encounters:  03/12/20 155 lb 6.4 oz (70.5 kg)  03/05/20 157 lb 1.6 oz (71.3 kg)  03/04/20 154 lb (69.9 kg)   Temp Readings from Last 3 Encounters:  03/12/20 (!) 97.5 F (36.4 C)  03/05/20 (!) 97.5 F (36.4 C) (Temporal)  02/28/20 97.6 F (36.4 C) (Oral)   BP Readings from Last 3 Encounters:  03/12/20 137/73  03/05/20 (!) 145/70  02/28/20 (!) 149/76   Pulse Readings from Last 3 Encounters:  03/12/20 95  03/05/20 89  02/28/20 (!) 57   Pain Assessment Pain Score: 0-No pain/10  In general this is a well appearing female in no acute distress. She's alert and oriented x4 and appropriate throughout the examination. Cardiopulmonary assessment is negative for acute distress and she exhibits normal effort.  There is visible  elevation however of the lateral aspect of the chest wall just to the left side of the sternum, the skin is intact without evidence of erosion, or any visible lesion or collateral vasculature seen superiorly of the skin.   ECOG = 1  0 - Asymptomatic (Fully active, able to carry on all predisease activities without restriction)  1 - Symptomatic but completely ambulatory (Restricted in physically strenuous activity but ambulatory and able to carry out work of a light or sedentary nature. For example, light housework, office work)  2 - Symptomatic, <50% in bed during the day (Ambulatory and capable of all self  care but unable to carry out any work activities. Up and about more than 50% of waking hours)  3 - Symptomatic, >50% in bed, but not bedbound (Capable of only limited self-care, confined to bed or chair 50% or more of waking hours)  4 - Bedbound (Completely disabled. Cannot carry on any self-care. Totally confined to bed or chair)  5 - Death   Eustace Pen MM, Creech RH, Tormey DC, et al. [phone]). "Toxicity and response criteria of the [facility]". [place] Oncol. 5 (6): 649-55    LABORATORY DATA:  Lab Results  Component Value Date   WBC 5.5 02/28/2020   HGB 14.8 02/28/2020   HCT 44.4 02/28/2020   MCV 96.9 02/28/2020   PLT 237 02/28/2020   Lab Results  Component Value Date   NA 142 02/21/2020   K 4.3 02/21/2020   CL 107 02/21/2020   CO2 26 02/21/2020   Lab Results  Component Value Date   ALT 18 02/21/2020   AST 20 02/21/2020   ALKPHOS 60 02/21/2020   BILITOT 0.4 02/21/2020      RADIOGRAPHY: NM PET Image Initial (PI) Skull Base To Thigh  Result Date: 03/04/2020 CLINICAL DATA:  Subsequent treatment strategy for breast carcinoma. EXAM: NUCLEAR MEDICINE PET SKULL BASE TO THIGH TECHNIQUE: 7.5 mCi F-18 FDG was injected intravenously. Full-ring PET imaging was performed from the skull base to thigh after the radiotracer. CT data was obtained and used for  attenuation correction and anatomic localization. Fasting blood glucose: 90 mg/dl COMPARISON:  None. FINDINGS: Mediastinal blood pool activity: SUV max 2.4 Liver activity: SUV max 3.4 NECK: No hypermetabolic lymph nodes in the neck. Incidental CT findings: none CHEST: Hypermetabolic mass in the medial RIGHT chest wall adjacent to the sternum between the second and third rib measures 4.1 cm. Chest wall mass extends into the LEFT upper lobe and elevates the pectoralis muscle. Mass is intensely hypermetabolic with SUV max equal 9.1. Three adjacent round nodules in the superior segment of the LEFT lower lobe. The largest nodule measures 7 mm image 73. This nodule does have faint radiotracer activity with SUV max equal 2.1. The several similar nodules in the LEFT upper lobe. For example 5 mm nodule image 62/4 the smaller nodules do not have radiotracer activity. Round nodule in the RIGHT lung middle lobe on image 92 measures 5 mm. Several smaller nodules in the RIGHT lower lobe on image 77. Incidental CT findings: No hypermetabolic mediastinal lymph nodes. ABDOMEN/PELVIS: 2 low-density lesions in the liver and I have radiotracer activity consistent benign lesions. Normal adrenal glands. No hypermetabolic abdominopelvic lymph nodes. Incidental CT findings: Post hysterectomy.  Adnexa unremarkable SKELETON: There is metabolic activity associated with two healed RIGHT posterolateral ribs on image 88 and 81. Incidental CT findings: None IMPRESSION: 1. Hypermetabolic RIGHT chest wall mass consistent with breast carcinoma. 2. Several round pulmonary nodules within the LEFT lung and to lesser degree the RIGHT lung. One larger nodule does have radiotracer activity. Findings are most consistent with pulmonary metastasis. 3. No evidence skeletal metastasis. Metabolic activity associated with healed RIGHT rib fractures are favored posttraumatic. Recommend clinical correlation. Electronically Signed   By: Suzy Bouchard M.D.   On:  03/04/2020 10:39   CT BIOPSY  Result Date: 02/28/2020 INDICATION: 60 year old female referred for biopsy of left chest wall mass, history of breast carcinoma EXAM: CT BIOPSY MEDICATIONS: None. ANESTHESIA/SEDATION: Moderate (conscious) sedation was employed during this procedure. A total of Versed 1.5 mg and Fentanyl 75 mcg was  administered intravenously. Moderate Sedation Time: 13 minutes. The patient's level of consciousness and vital signs were monitored continuously by radiology nursing throughout the procedure under my direct supervision. FLUOROSCOPY TIME:  CT COMPLICATIONS: None PROCEDURE: The procedure, risks, benefits, and alternatives were explained to the patient and the patient's family. Specific risks that were addressed included bleeding, infection, pneumothorax, need for further procedure including chest tube placement, chance of delayed pneumothorax or hemorrhage, hemoptysis, nondiagnostic sample, cardiopulmonary collapse, death. Questions regarding the procedure were encouraged and answered. The patient understands and consents to the procedure. Patient was positioned in the supine position on the CT gantry table and a scout CT of the chest was performed for planning purposes. Once angle of approach was determined, the skin and subcutaneous tissues this scan was prepped and draped in the usual sterile fashion, and a sterile drape was applied covering the operative field. A sterile gown and sterile gloves were used for the procedure. Local anesthesia was provided with 1% Lidocaine. The skin and subcutaneous tissues were infiltrated 1% lidocaine for local anesthesia. Using CT guidance, a 17 gauge trocar needle was advanced into the parasternal, anterior chest walltarget. After confirmation of the tip, separate 18 gauge core biopsies were performed. These were placed into formalin solution for transportation to the lab. A final CT image was performed. Patient tolerated the procedure well and remained  hemodynamically stable throughout. No complications were encountered and no significant blood loss was encounter IMPRESSION: Status post CT-guided biopsy of left anterior chest wall mass. Signed, Dulcy Fanny. Dellia Nims, RPVI [facility] Radiology Electronically Signed   By: Corrie Mckusick D.O.   On: 02/28/2020 14:11       IMPRESSION/PLAN: 1. Recurrent metastatic breast carcinoma. Dr. Lisbeth Renshaw discusses the pathology findings and reviews the nature of recurrent metastatic disease.  He discusses the option of considering radiotherapy in a palliative setting for the chest wall.  Surprising that the patient is not having significant symptoms given the size and location of her disease.  Dr. Lisbeth Renshaw did spend time in discussion with Dr. Lindi Davis as well about her course and neck steps for her treatment. We discussed the risks, benefits, short, and long term effects of radiotherapy, as well as the palliative intent.  Dr. Lisbeth Renshaw would offer a 2 to 3-week course of treatment depending on her systemic therapy.  After further discussion both with Dr. Lisbeth Renshaw, Dr. Lindi Davis and the patient, we will plan to follow-up with repeat imaging of the patient's chest within  2 months to determine her response to [facility] and anastrozole.  If her symptoms worsen however in the interim, we would be happy to revisit the option of pursuing radiation sooner.  The patient is in agreement with this plan. 2. Possible genetic predisposition to malignancy. The patient is a candidate for genetic testing given her personal history. She was offered referral and has already met with genetics and is awaiting testing results.  In a visit lasting 60 minutes, greater than 50% of the time was spent face to face discussing the patient's condition, in preparation for the discussion, and coordinating the patient's care.   The above documentation reflects my direct findings during this shared patient visit. Please see  the separate note by Dr. Lisbeth Renshaw on this date for the remainder of the patient's plan of care.    Carola Rhine, [facility]   **Disclaimer: This note was dictated with voice recognition software. Similar sounding words can inadvertently be transcribed and this note may contain  transcription errors which may not have been corrected upon publication of note.**

## 2020-03-16 NOTE — Addendum Note (Signed)
Encounter addended by: Hayden Pedro, PA-C on: 03/16/2020 12:25 PM  Actions taken: Clinical Note Signed

## 2020-03-18 ENCOUNTER — Encounter: Payer: Self-pay | Admitting: Adult Health

## 2020-03-19 ENCOUNTER — Telehealth: Payer: Self-pay | Admitting: Hematology and Oncology

## 2020-03-19 NOTE — Telephone Encounter (Signed)
Scheduled appt per 2/16 sch msg - pt is aware of appt on 3/2

## 2020-03-31 ENCOUNTER — Encounter (HOSPITAL_COMMUNITY): Payer: Self-pay

## 2020-03-31 NOTE — Progress Notes (Signed)
Patient Care Team: Ephriam Jenkins, PA as PCP - General (Physician Assistant)  DIAGNOSIS:    ICD-10-CM   1. Metastatic breast cancer ([place])  C50.919     SUMMARY OF ONCOLOGIC HISTORY: Oncology History  Metastatic breast cancer ([place])  2010 Initial Biopsy   Right breast DCIS: Right mastectomy with reconstruction in 2011 in [place], did not take antiestrogen therapy   2016 Initial Biopsy   Left breast cancer: Left mastectomy with reconstruction 12/04/2014: Pathology revealed grade 2 IDC ER 75%, PR 75%, HER2 negative, margins clear, 0/2 lymph nodes negative, treated at [facility], patient was not prescribed antiestrogen treatment    Relapse/Recurrence   Palpable left breast mass December 2021. CT chest 02/10/2018 Heterogeneous mass left anterior chest bordering the sternum 6.2 x 4 x 4.2 cm.  Multiple bilateral lung nodules LUL: 5 mm, LLL: 7 mm, 2 adjacent nodules LLL: 5 mm, LUL lingula: 6 mm, RML: 5 mm, hazy linear reticulonodular opacities, 4 low-density liver masses 3 cm likely cysts   03/25/2020 Miscellaneous   [facility] molecular testing: PI K3 CA mutation present, ER positive, PD-L1 positive   03/30/2020 Cancer Staging   Staging form: Breast, AJCC 8th Edition - Clinical: Stage IV (rcTX, cNX, cM1, G2, ER+, PR+, HER2-) - Signed by Nicholas Lose, MD on 03/30/2020 Stage prefix: Recurrence     CHIEF COMPLIANT: Follow-up of metastatic breast cancer on Ibrance  INTERVAL HISTORY: Melanie Davis is a 60 y.o. with above-mentioned history of metastatic breast cancer currently on treatment with Ibrance. She presents to the clinic today for follow-up.  She has complained of diarrhea 3-4 times per day regularly.  She is also had a couple of days of profound fatigue.  She denies any fevers or chills.  She does have hot flashes which she had even before starting letrozole.  Molecular testing with [facility] was performed which detected PI K3 CA mutation.  Also PD-L1 positive and ER  positivity.  ALLERGIES:  has No Known Allergies.  MEDICATIONS:  Current Outpatient Medications  Medication Sig Dispense Refill  . albuterol (VENTOLIN HFA) 108 (90 Base) MCG/ACT inhaler Inhale 1 puff into the lungs every 4 (four) hours as needed for wheezing or shortness of breath.    . anastrozole (ARIMIDEX) 1 MG tablet Take 1 tablet (1 mg total) by mouth daily. 90 tablet 3  . beclomethasone (QVAR) 40 MCG/ACT inhaler Inhale into the lungs 2 (two) times daily.    . fluticasone (FLONASE) 50 MCG/ACT nasal spray Place 1 spray into both nostrils daily as needed for allergies or rhinitis.    Marland Kitchen levothyroxine (SYNTHROID) 100 MCG tablet Take 100 mcg by mouth daily before breakfast.    . lisinopril (ZESTRIL) 5 MG tablet Take 5 mg by mouth daily.    . palbociclib (IBRANCE) 125 MG tablet Take 1 tablet (125 mg total) by mouth daily. Take for 21 days on, 7 days off, repeat every 28 days. 21 tablet 6  . triamcinolone ointment (KENALOG) 0.5 % Apply 1 application topically 2 (two) times daily. 30 g 0   No current facility-administered medications for this visit.    PHYSICAL EXAMINATION: ECOG PERFORMANCE STATUS: 1 - Symptomatic but completely ambulatory  Vitals:   04/01/20 1546  BP: 101/71  Pulse: (!) 102  Resp: 20  Temp: 97.9 F (36.6 C)  SpO2: 98%   Filed Weights   04/01/20 1546  Weight: 156 lb 1.6 oz (70.8 kg)     LABORATORY DATA:  I have reviewed the data as listed  CMP Latest Ref Rng & Units 04/01/2020 02/21/2020  Glucose 70 - 99 mg/dL 96 99  BUN 6 - 20 mg/dL 12 13  Creatinine 0.44 - 1.00 mg/dL 0.98 0.87  Sodium 135 - 145 mmol/L 143 142  Potassium 3.5 - 5.1 mmol/L 4.2 4.3  Chloride 98 - 111 mmol/L 108 107  CO2 22 - 32 mmol/L 28 26  Calcium 8.9 - 10.3 mg/dL 9.0 9.4  Total Protein 6.5 - 8.1 g/dL 6.7 7.2  Total Bilirubin 0.3 - 1.2 mg/dL 0.6 0.4  Alkaline Phos 38 - 126 U/L 53 60  AST 15 - 41 U/L 14(L) 20  ALT 0 - 44 U/L 17 18    Lab Results  Component Value Date   WBC 2.7 (L)  04/01/2020   HGB 12.7 04/01/2020   HCT 38.3 04/01/2020   MCV 96.2 04/01/2020   PLT 104 (L) 04/01/2020   NEUTROABS 0.8 (L) 04/01/2020    ASSESSMENT & PLAN:  Metastatic breast cancer ([place]) Right breastcancer in 2010 for which she underwent a right mastectomy with reconstruction in 2011.  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   Recurrence: Chest CT on 02/11/20 showed left anterior chest mass 6.2 cm. Multiple bilateral pulmonary nodules consistent with metastatic disease.  Biopsy shows invasive ductal carcinoma, ER positive, with Ki-67 of 1%  PET scan on 03/04/2020 shows hypermetabolic activity on the lungs consistent with metastases, however less than 5m   Plan: Ibrance with anastrozole [facility] Genomic testing: PIK 3 CA mutation present, PD-L1 positive, ER positive (no BRCA mutation)  New chest wall nodule: on the right side  Ibrance Toxicities: 1.  Diarrhea: I instructed her to take Imodium. 2. fatigue: Manageable 3.  Hot flashes 4.  Decreased sleep: We discussed about sleep aids but she wants to avoid taking sleep aids. 5.  ANC today 0.8.  However this is day 20 one of her treatment.  I anticipate next week her blood counts will be better. She will come back next week for lab work. If APawneeis greater than 1 she will proceed with the same dosage.  So far patient does not think the tumor is getting any smaller.   No orders of the defined types were placed in this encounter.  The patient has a good understanding of the overall plan. she agrees with it. she will call with any problems that may develop before the next visit here.  Total time spent: 30 mins including face to face time and time spent for planning, charting and coordination of care  VRulon Eisenmenger MD, MPH 04/01/2020  I, MCloyde Reams Dorshimer, am acting as scribe for Dr. VNicholas Lose  I have reviewed the above documentation for accuracy and completeness, and I agree with the above.

## 2020-03-31 NOTE — Assessment & Plan Note (Signed)
Right breastcancer in 2010 for which she underwent a right mastectomy with reconstruction in 2011.  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   Recurrence: Chest CT on 02/11/20 showed left anterior chest mass 6.2 cm. Multiple bilateral pulmonary nodules consistent with metastatic disease.  Biopsy shows invasive ductal carcinoma, ER positive, with Ki-67 of 1%  PET scan on 03/04/2020 shows hypermetabolic activity on the lungs consistent with metastases, however less than 63m   Plan: 1. Add Ibrance to Anastrozole 2. Genetic testing 3. [facility] Genomic testing 4. Rad onc consult: if XRT is indicated, ILeslee Davis be held briefly. Since ses asymptomatic, we might hold off on doing XRT right away  New chest wall nodule: on the right side  Ibrance Toxicities:

## 2020-04-01 ENCOUNTER — Encounter: Payer: Self-pay | Admitting: *Deleted

## 2020-04-01 ENCOUNTER — Other Ambulatory Visit: Payer: Self-pay

## 2020-04-01 ENCOUNTER — Inpatient Hospital Stay: Payer: BC Managed Care – PPO

## 2020-04-01 ENCOUNTER — Inpatient Hospital Stay: Payer: BC Managed Care – PPO | Attending: Hematology and Oncology | Admitting: Hematology and Oncology

## 2020-04-01 DIAGNOSIS — C50919 Malignant neoplasm of unspecified site of unspecified female breast: Secondary | ICD-10-CM

## 2020-04-01 DIAGNOSIS — R222 Localized swelling, mass and lump, trunk: Secondary | ICD-10-CM | POA: Diagnosis not present

## 2020-04-01 DIAGNOSIS — Z853 Personal history of malignant neoplasm of breast: Secondary | ICD-10-CM | POA: Diagnosis not present

## 2020-04-01 DIAGNOSIS — R197 Diarrhea, unspecified: Secondary | ICD-10-CM | POA: Diagnosis not present

## 2020-04-01 DIAGNOSIS — Z170 Estrogen receptor positive status [ER+]: Secondary | ICD-10-CM | POA: Diagnosis not present

## 2020-04-01 DIAGNOSIS — N951 Menopausal and female climacteric states: Secondary | ICD-10-CM | POA: Diagnosis not present

## 2020-04-01 DIAGNOSIS — C7802 Secondary malignant neoplasm of left lung: Secondary | ICD-10-CM | POA: Diagnosis not present

## 2020-04-01 DIAGNOSIS — Z9013 Acquired absence of bilateral breasts and nipples: Secondary | ICD-10-CM | POA: Insufficient documentation

## 2020-04-01 DIAGNOSIS — C50812 Malignant neoplasm of overlapping sites of left female breast: Secondary | ICD-10-CM | POA: Insufficient documentation

## 2020-04-01 DIAGNOSIS — R5383 Other fatigue: Secondary | ICD-10-CM | POA: Insufficient documentation

## 2020-04-01 DIAGNOSIS — C7801 Secondary malignant neoplasm of right lung: Secondary | ICD-10-CM | POA: Diagnosis not present

## 2020-04-01 LAB — CBC WITH DIFFERENTIAL (CANCER CENTER ONLY)
Abs Immature Granulocytes: 0.01 10*3/uL (ref 0.00–0.07)
Basophils Absolute: 0.1 10*3/uL (ref 0.0–0.1)
Basophils Relative: 2 %
Eosinophils Absolute: 0.1 10*3/uL (ref 0.0–0.5)
Eosinophils Relative: 3 %
HCT: 38.3 % (ref 36.0–46.0)
Hemoglobin: 12.7 g/dL (ref 12.0–15.0)
Immature Granulocytes: 0 %
Lymphocytes Relative: 63 %
Lymphs Abs: 1.7 10*3/uL (ref 0.7–4.0)
MCH: 31.9 pg (ref 26.0–34.0)
MCHC: 33.2 g/dL (ref 30.0–36.0)
MCV: 96.2 fL (ref 80.0–100.0)
Monocytes Absolute: 0.1 10*3/uL (ref 0.1–1.0)
Monocytes Relative: 4 %
Neutro Abs: 0.8 10*3/uL — ABNORMAL LOW (ref 1.7–7.7)
Neutrophils Relative %: 28 %
Platelet Count: 104 10*3/uL — ABNORMAL LOW (ref 150–400)
RBC: 3.98 MIL/uL (ref 3.87–5.11)
RDW: 12.6 % (ref 11.5–15.5)
WBC Count: 2.7 10*3/uL — ABNORMAL LOW (ref 4.0–10.5)
nRBC: 0 % (ref 0.0–0.2)

## 2020-04-01 LAB — CMP (CANCER CENTER ONLY)
ALT: 17 U/L (ref 0–44)
AST: 14 U/L — ABNORMAL LOW (ref 15–41)
Albumin: 4 g/dL (ref 3.5–5.0)
Alkaline Phosphatase: 53 U/L (ref 38–126)
Anion gap: 7 (ref 5–15)
BUN: 12 mg/dL (ref 6–20)
CO2: 28 mmol/L (ref 22–32)
Calcium: 9 mg/dL (ref 8.9–10.3)
Chloride: 108 mmol/L (ref 98–111)
Creatinine: 0.98 mg/dL (ref 0.44–1.00)
GFR, Estimated: >60 mL/min (ref >60)
Glucose, Bld: 96 mg/dL (ref 70–99)
Potassium: 4.2 mmol/L (ref 3.5–5.1)
Sodium: 143 mmol/L (ref 135–145)
Total Bilirubin: 0.6 mg/dL (ref 0.3–1.2)
Total Protein: 6.7 g/dL (ref 6.5–8.1)

## 2020-04-02 MED FILL — IBRANCE 125 MG TABS: 125 | 28 days supply | Qty: 21 | Fill #1

## 2020-04-03 ENCOUNTER — Telehealth: Payer: Self-pay | Admitting: Hematology and Oncology

## 2020-04-03 NOTE — Telephone Encounter (Signed)
Scheduled per 3/2 los. Called pt and left a msg

## 2020-04-08 ENCOUNTER — Encounter: Payer: Self-pay | Admitting: Adult Health

## 2020-04-08 ENCOUNTER — Inpatient Hospital Stay: Payer: BC Managed Care – PPO

## 2020-04-08 ENCOUNTER — Other Ambulatory Visit: Payer: Self-pay

## 2020-04-08 DIAGNOSIS — C50919 Malignant neoplasm of unspecified site of unspecified female breast: Secondary | ICD-10-CM

## 2020-04-08 LAB — GENETIC SCREENING ORDER

## 2020-04-10 ENCOUNTER — Telehealth: Payer: Self-pay | Admitting: Genetic Counselor

## 2020-04-10 ENCOUNTER — Encounter: Payer: Self-pay | Admitting: Hematology and Oncology

## 2020-04-10 ENCOUNTER — Other Ambulatory Visit: Payer: Self-pay | Admitting: *Deleted

## 2020-04-10 DIAGNOSIS — C50919 Malignant neoplasm of unspecified site of unspecified female breast: Secondary | ICD-10-CM

## 2020-04-10 NOTE — Telephone Encounter (Signed)
LVM requesting that Melanie Davis call back to discuss whether she is interested in genetic testing. Her blood was drawn for genetics on Wednesday (3/9), but we will not place an order until we confirm whether she would like to move forward with genetic testing.

## 2020-04-13 ENCOUNTER — Inpatient Hospital Stay: Payer: BC Managed Care – PPO

## 2020-04-13 ENCOUNTER — Other Ambulatory Visit: Payer: Self-pay

## 2020-04-13 DIAGNOSIS — C50812 Malignant neoplasm of overlapping sites of left female breast: Secondary | ICD-10-CM | POA: Diagnosis not present

## 2020-04-13 DIAGNOSIS — C50919 Malignant neoplasm of unspecified site of unspecified female breast: Secondary | ICD-10-CM

## 2020-04-13 LAB — CMP (CANCER CENTER ONLY)
ALT: 28 U/L (ref 0–44)
AST: 23 U/L (ref 15–41)
Albumin: 3.9 g/dL (ref 3.5–5.0)
Alkaline Phosphatase: 51 U/L (ref 38–126)
Anion gap: 6 (ref 5–15)
BUN: 15 mg/dL (ref 6–20)
CO2: 28 mmol/L (ref 22–32)
Calcium: 9 mg/dL (ref 8.9–10.3)
Chloride: 106 mmol/L (ref 98–111)
Creatinine: 0.99 mg/dL (ref 0.44–1.00)
GFR, Estimated: >60 mL/min (ref >60)
Glucose, Bld: 100 mg/dL — ABNORMAL HIGH (ref 70–99)
Potassium: 4.2 mmol/L (ref 3.5–5.1)
Sodium: 140 mmol/L (ref 135–145)
Total Bilirubin: 0.4 mg/dL (ref 0.3–1.2)
Total Protein: 6.6 g/dL (ref 6.5–8.1)

## 2020-04-13 LAB — CBC WITH DIFFERENTIAL (CANCER CENTER ONLY)
Abs Immature Granulocytes: 0 10*3/uL (ref 0.00–0.07)
Basophils Absolute: 0.1 10*3/uL (ref 0.0–0.1)
Basophils Relative: 4 %
Eosinophils Absolute: 0.2 10*3/uL (ref 0.0–0.5)
Eosinophils Relative: 6 %
HCT: 37.9 % (ref 36.0–46.0)
Hemoglobin: 12.7 g/dL (ref 12.0–15.0)
Immature Granulocytes: 0 %
Lymphocytes Relative: 33 %
Lymphs Abs: 0.8 10*3/uL (ref 0.7–4.0)
MCH: 32.8 pg (ref 26.0–34.0)
MCHC: 33.5 g/dL (ref 30.0–36.0)
MCV: 97.9 fL (ref 80.0–100.0)
Monocytes Absolute: 0.2 10*3/uL (ref 0.1–1.0)
Monocytes Relative: 6 %
Neutro Abs: 1.3 10*3/uL — ABNORMAL LOW (ref 1.7–7.7)
Neutrophils Relative %: 51 %
Platelet Count: 221 10*3/uL (ref 150–400)
RBC: 3.87 MIL/uL (ref 3.87–5.11)
RDW: 14.5 % (ref 11.5–15.5)
WBC Count: 2.6 10*3/uL — ABNORMAL LOW (ref 4.0–10.5)
nRBC: 0 % (ref 0.0–0.2)

## 2020-04-30 ENCOUNTER — Other Ambulatory Visit (HOSPITAL_COMMUNITY): Payer: Self-pay

## 2020-04-30 ENCOUNTER — Other Ambulatory Visit: Payer: Self-pay | Admitting: *Deleted

## 2020-04-30 DIAGNOSIS — C50919 Malignant neoplasm of unspecified site of unspecified female breast: Secondary | ICD-10-CM

## 2020-05-03 NOTE — Progress Notes (Signed)
Patient Care Team: Ephriam Jenkins, PA as PCP - General (Physician Assistant)  DIAGNOSIS:    ICD-10-CM   1. 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 CT chest 02/10/2018 Heterogeneous mass left anterior chest bordering the sternum 6.2 x 4 x 4.2 cm.  Multiple bilateral lung nodules LUL: 5 mm, LLL: 7 mm, 2 adjacent nodules LLL: 5 mm, LUL lingula: 6 mm, RML: 5 mm, hazy linear reticulonodular opacities, 4 low-density liver masses 3 cm likely cysts   03/25/2020 Miscellaneous   [facility] molecular testing: PI K3 CA mutation present, ER positive, PD-L1 positive   03/30/2020 Cancer Staging   Staging form: Breast, AJCC 8th Edition - Clinical: Stage IV (rcTX, cNX, cM1, G2, ER+, PR+, HER2-) - Signed by Nicholas Lose, MD on 03/30/2020 Stage prefix: Recurrence     CHIEF COMPLIANT: Follow-up of metastatic breast cancer on Ibrance  INTERVAL HISTORY: Melanie Davis is a 60 y.o. with above-mentioned history of metastatic breast cancer currently on treatment with Ibrance. She presents to the clinic today for follow-up.   She is handling the Ibrance reasonably well except for diarrhea.  She is not keen on taking any antidiarrheal medication but she is able to handle it.  She feels extremely tired by the end of the day and she sleeps at 8 PM or so but does not get a full night sleep and she is generally restless and wakes up with hot  flashes periodically.  Denies any nausea or vomiting.  She thinks that the tumor on the chest wall is not getting any smaller.  ALLERGIES:  has No Known Allergies.  MEDICATIONS:  Current Outpatient Medications  Medication Sig Dispense Refill  . albuterol (VENTOLIN HFA) 108 (90 Base) MCG/ACT inhaler Inhale 1 puff into the lungs every 4 (four) hours as needed for wheezing or shortness of breath.    . anastrozole (ARIMIDEX) 1 MG tablet Take 1 tablet (1 mg total) by mouth daily. 90 tablet 3  . beclomethasone (QVAR) 40 MCG/ACT inhaler Inhale into the lungs 2 (two) times daily.    . fluticasone (FLONASE) 50 MCG/ACT nasal spray Place 1 spray into both nostrils daily as needed for allergies or rhinitis.    Marland Kitchen levothyroxine (SYNTHROID) 100 MCG tablet Take 100 mcg by mouth daily before breakfast.    . lisinopril (ZESTRIL) 5 MG tablet Take 5 mg by mouth daily.    . palbociclib (IBRANCE) 125 MG tablet TAKE 1 TABLET (125 MG TOTAL) BY MOUTH DAILY. TAKE FOR 21 DAYS ON, 7 DAYS OFF, REPEAT EVERY 28 DAYS. 21 tablet 6  . triamcinolone ointment (KENALOG) 0.5 % Apply 1 application topically 2 (two) times daily. 30 g 0   No current facility-administered medications for this visit.    PHYSICAL EXAMINATION: ECOG PERFORMANCE STATUS: 1 - Symptomatic but completely ambulatory  Vitals:   05/04/20 1020  BP: (!) 142/75  Pulse: 78  Resp: 18  Temp: 97.9 F (36.6 C)  SpO2: 100%  Filed Weights   05/04/20 1020  Weight: 155 lb 14.4 oz (70.7 kg)    LABORATORY DATA:  I have reviewed the data as listed CMP Latest Ref Rng & Units 05/04/2020 04/13/2020 04/01/2020  Glucose 70 - 99 mg/dL 97 100(H) 96  BUN 6 - 20 mg/dL _0 Creatinine 0.44 - 1.00 mg/dL 0.85 0.99 0.98  Sodium 135 - 145 mmol/L 142 140 143  Potassium 3.5 - 5.1 mmol/L 4.0 4.2 4.2  Chloride 98 - 111 mmol/L 107 106 108  CO2 22 - 32 mmol/L _1 Calcium 8.9 - 10.3 mg/dL 8.7(L) 9.0 9.0  Total Protein 6.5 - 8.1 g/dL 6.7 6.6 6.7  Total Bilirubin 0.3 -  1.2 mg/dL 0.6 0.4 0.6  Alkaline Phos 38 - 126 U/L 49 51 53  AST 15 - 41 U/L 38 23 14(L)  ALT 0 - 44 U/L 87(H) 28 17    Lab Results  Component Value Date   WBC 2.1 (L) 05/04/2020   HGB 12.7 05/04/2020   HCT 38.0 05/04/2020   MCV 101.1 (H) 05/04/2020   PLT 158 05/04/2020   NEUTROABS 0.9 (L) 05/04/2020    ASSESSMENT & PLAN:  Metastatic breast cancer ([place]) Right breastcancer in 2010 for which she underwent a right mastectomy with reconstruction in 2011.  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   Recurrence: Chest CT on 02/11/20 showed left anterior chest mass 6.2 cm. Multiple bilateral pulmonary nodules consistent with metastatic disease.  Biopsy shows invasive ductal carcinoma, ER positive, with Ki-67 of 1%  PET scan on 03/04/2020 shows hypermetabolic activity on the lungs consistent with metastases, however less than 70m  [facility] Genomic testing: PIK 3 CA mutation present, PD-L1 positive, ER positive (no BRCA mutation) ------------------------------------------------------------------------------------------------------------------------------------------- Current Treatment: Ibrance with anastrozole  Ibrance Toxicities: 1.  Diarrhea: Patient is managing without taking Imodium. 2. fatigue: Moderate 3.  Hot flashes: Wake her up at night 4.  Decreased sleep: Not interested in any sleep aids 5.  ANC today 0.9.    We will lower the dosage of Ibrance with the next cycle.  So far patient does not think the tumor is getting any smaller.  Therefore I will get a CT of her chest for further evaluation. Return to clinic in 1 week to discuss the results of the CT scan.    Orders Placed This Encounter  Procedures  . CT Chest W Contrast    Standing Status:   Future    Standing Expiration Date:   05/04/2021     Order Specific Question:   If indicated for the ordered procedure, I authorize the administration of contrast media per Radiology protocol    Answer:   Yes    Order Specific Question:   Is patient pregnant?    Answer:   No    Order Specific Question:   [facility]    [facility]   Order Specific Question:   Release to patient    Answer:   Immediate   The patient has a good understanding of the overall plan. she agrees with it. she will call with any problems that may develop before the next visit here.  Total time spent: 30 mins including face to face time and time spent for planning, charting and coordination of care  Dawnn Nam  Loyal Gambler, MD, MPH 05/04/2020  I, Molly Dorshimer, am acting as scribe for Dr. Nicholas Lose.  I have reviewed the above documentation for accuracy and completeness, and I agree with the above.

## 2020-05-03 NOTE — Assessment & Plan Note (Signed)
Right breastcancer in 2010 for which she underwent a right mastectomy with reconstruction in 2011.  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   Recurrence: Chest CT on 02/11/20 showed left anterior chest mass 6.2 cm. Multiple bilateral pulmonary nodules consistent with metastatic disease.  Biopsy shows invasive ductal carcinoma, ER positive, with Ki-67 of 1%  PET scan on 03/04/2020 shows hypermetabolic activity on the lungs consistent with metastases, however less than 12m   Plan: Ibrance with anastrozole [facility] Genomic testing: PIK 3 CA mutation present, PD-L1 positive, ER positive (no BRCA mutation)  New chest wall nodule: on the right side  Ibrance Toxicities: 1.  Diarrhea: I instructed her to take Imodium. 2. fatigue: Manageable 3.  Hot flashes 4.  Decreased sleep: We discussed about sleep aids but she wants to avoid taking sleep aids. 5.  ANC today 0.8.  However this is day 20 one of her treatment.  I anticipate next week her blood counts will be better. She will come back next week for lab work. If ASan Ardois greater than 1 she will proceed with the same dosage.  So far patient does not think the tumor is getting any smaller.

## 2020-05-04 ENCOUNTER — Telehealth: Payer: Self-pay | Admitting: Hematology and Oncology

## 2020-05-04 ENCOUNTER — Other Ambulatory Visit: Payer: Self-pay

## 2020-05-04 ENCOUNTER — Inpatient Hospital Stay: Payer: BC Managed Care – PPO | Attending: Hematology and Oncology

## 2020-05-04 ENCOUNTER — Inpatient Hospital Stay: Payer: BC Managed Care – PPO | Admitting: Hematology and Oncology

## 2020-05-04 DIAGNOSIS — C50919 Malignant neoplasm of unspecified site of unspecified female breast: Secondary | ICD-10-CM | POA: Diagnosis not present

## 2020-05-04 DIAGNOSIS — N951 Menopausal and female climacteric states: Secondary | ICD-10-CM | POA: Diagnosis not present

## 2020-05-04 DIAGNOSIS — R7989 Other specified abnormal findings of blood chemistry: Secondary | ICD-10-CM | POA: Insufficient documentation

## 2020-05-04 DIAGNOSIS — C7801 Secondary malignant neoplasm of right lung: Secondary | ICD-10-CM | POA: Insufficient documentation

## 2020-05-04 DIAGNOSIS — C50812 Malignant neoplasm of overlapping sites of left female breast: Secondary | ICD-10-CM | POA: Diagnosis not present

## 2020-05-04 DIAGNOSIS — R5383 Other fatigue: Secondary | ICD-10-CM | POA: Diagnosis not present

## 2020-05-04 DIAGNOSIS — Z170 Estrogen receptor positive status [ER+]: Secondary | ICD-10-CM | POA: Insufficient documentation

## 2020-05-04 DIAGNOSIS — Z9013 Acquired absence of bilateral breasts and nipples: Secondary | ICD-10-CM | POA: Diagnosis not present

## 2020-05-04 DIAGNOSIS — C7802 Secondary malignant neoplasm of left lung: Secondary | ICD-10-CM | POA: Diagnosis not present

## 2020-05-04 LAB — CMP (CANCER CENTER ONLY)
ALT: 87 U/L — ABNORMAL HIGH (ref 0–44)
AST: 38 U/L (ref 15–41)
Albumin: 4.1 g/dL (ref 3.5–5.0)
Alkaline Phosphatase: 49 U/L (ref 38–126)
Anion gap: 12 (ref 5–15)
BUN: 13 mg/dL (ref 6–20)
CO2: 23 mmol/L (ref 22–32)
Calcium: 8.7 mg/dL — ABNORMAL LOW (ref 8.9–10.3)
Chloride: 107 mmol/L (ref 98–111)
Creatinine: 0.85 mg/dL (ref 0.44–1.00)
GFR, Estimated: >60 mL/min (ref >60)
Glucose, Bld: 97 mg/dL (ref 70–99)
Potassium: 4 mmol/L (ref 3.5–5.1)
Sodium: 142 mmol/L (ref 135–145)
Total Bilirubin: 0.6 mg/dL (ref 0.3–1.2)
Total Protein: 6.7 g/dL (ref 6.5–8.1)

## 2020-05-04 LAB — CBC WITH DIFFERENTIAL (CANCER CENTER ONLY)
Abs Immature Granulocytes: 0.01 10*3/uL (ref 0.00–0.07)
Basophils Absolute: 0.1 10*3/uL (ref 0.0–0.1)
Basophils Relative: 3 %
Eosinophils Absolute: 0.1 10*3/uL (ref 0.0–0.5)
Eosinophils Relative: 5 %
HCT: 38 % (ref 36.0–46.0)
Hemoglobin: 12.7 g/dL (ref 12.0–15.0)
Immature Granulocytes: 1 %
Lymphocytes Relative: 40 %
Lymphs Abs: 0.9 10*3/uL (ref 0.7–4.0)
MCH: 33.8 pg (ref 26.0–34.0)
MCHC: 33.4 g/dL (ref 30.0–36.0)
MCV: 101.1 fL — ABNORMAL HIGH (ref 80.0–100.0)
Monocytes Absolute: 0.2 10*3/uL (ref 0.1–1.0)
Monocytes Relative: 10 %
Neutro Abs: 0.9 10*3/uL — ABNORMAL LOW (ref 1.7–7.7)
Neutrophils Relative %: 41 %
Platelet Count: 158 10*3/uL (ref 150–400)
RBC: 3.76 MIL/uL — ABNORMAL LOW (ref 3.87–5.11)
RDW: 16.5 % — ABNORMAL HIGH (ref 11.5–15.5)
WBC Count: 2.1 10*3/uL — ABNORMAL LOW (ref 4.0–10.5)
nRBC: 0 % (ref 0.0–0.2)

## 2020-05-04 MED ORDER — PALBOCICLIB 100 MG PO TABS: 100.0000 mg | ORAL_TABLET | Freq: Every day | ORAL | 6 refills | Status: DC

## 2020-05-04 NOTE — Telephone Encounter (Signed)
Scheduled appt per 4/4 los. Pt aware.

## 2020-05-05 ENCOUNTER — Other Ambulatory Visit: Payer: Self-pay | Admitting: *Deleted

## 2020-05-05 ENCOUNTER — Encounter: Payer: Self-pay | Admitting: Adult Health

## 2020-05-05 ENCOUNTER — Other Ambulatory Visit (HOSPITAL_COMMUNITY): Payer: Self-pay

## 2020-05-05 DIAGNOSIS — C50919 Malignant neoplasm of unspecified site of unspecified female breast: Secondary | ICD-10-CM

## 2020-05-05 MED ORDER — PALBOCICLIB 100 MG PO TABS
100.0000 mg | ORAL_TABLET | Freq: Every day | ORAL | 6 refills | Status: DC
  Filled 2020-05-05: qty 21, 28d supply, fill #0
  Filled 2020-05-27: qty 21, 28d supply, fill #1

## 2020-05-08 ENCOUNTER — Other Ambulatory Visit: Payer: Self-pay

## 2020-05-08 ENCOUNTER — Ambulatory Visit (HOSPITAL_COMMUNITY)
Admission: RE | Admit: 2020-05-08 | Discharge: 2020-05-08 | Disposition: A | Discharge status: Home / Self Care | Payer: BC Managed Care – PPO | Source: Ambulatory Visit | Attending: Hematology and Oncology | Admitting: Hematology and Oncology

## 2020-05-08 ENCOUNTER — Encounter (HOSPITAL_COMMUNITY): Payer: Self-pay

## 2020-05-08 DIAGNOSIS — C50919 Malignant neoplasm of unspecified site of unspecified female breast: Secondary | ICD-10-CM

## 2020-05-08 MED ORDER — IOHEXOL 300 MG/ML  SOLN
75.0000 mL | Freq: Once | INTRAMUSCULAR | Status: AC | PRN
  Administered 2020-05-08: 75 mL via INTRAVENOUS

## 2020-05-11 ENCOUNTER — Encounter: Payer: Self-pay | Admitting: Adult Health

## 2020-05-11 NOTE — Progress Notes (Signed)
Patient Care Team: Ephriam Jenkins, PA as PCP - General (Physician Assistant)  DIAGNOSIS:    ICD-10-CM   1. 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 CT chest 02/10/2018 Heterogeneous mass left anterior chest bordering the sternum 6.2 x 4 x 4.2 cm.  Multiple bilateral lung nodules LUL: 5 mm, LLL: 7 mm, 2 adjacent nodules LLL: 5 mm, LUL lingula: 6 mm, RML: 5 mm, hazy linear reticulonodular opacities, 4 low-density liver masses 3 cm likely cysts   03/25/2020 Miscellaneous   [facility] molecular testing: PI K3 CA mutation present, ER positive, PD-L1 positive   03/30/2020 Cancer Staging   Staging form: Breast, AJCC 8th Edition - Clinical: Stage IV (rcTX, cNX, cM1, G2, ER+, PR+, HER2-) - Signed by Nicholas Lose, MD on 03/30/2020 Stage prefix: Recurrence     CHIEF COMPLIANT: Follow-up of metastatic breast cancer on Ibrance  INTERVAL HISTORY: Melanie Davis is a 60 y.o. with above-mentioned history of metastatic breast cancer currently on treatment with Ibrance. Chest CT on 05/08/20 showed stable metastases and no new or progressive disease.She presents to the clinic todayfor follow-up. She reports no new symptoms or concerns.  We reduce the dosage of Ibrance and she did start the new dosage.  She reports feeling quite well otherwise.  Denies any pain or  discomfort.  ALLERGIES:  has No Known Allergies.  MEDICATIONS:  Current Outpatient Medications  Medication Sig Dispense Refill  . albuterol (VENTOLIN HFA) 108 (90 Base) MCG/ACT inhaler Inhale 1 puff into the lungs every 4 (four) hours as needed for wheezing or shortness of breath.    . anastrozole (ARIMIDEX) 1 MG tablet Take 1 tablet (1 mg total) by mouth daily. 90 tablet 3  . beclomethasone (QVAR) 40 MCG/ACT inhaler Inhale into the lungs 2 (two) times daily.    . fluticasone (FLONASE) 50 MCG/ACT nasal spray Place 1 spray into both nostrils daily as needed for allergies or rhinitis.    Marland Kitchen levothyroxine (SYNTHROID) 100 MCG tablet Take 100 mcg by mouth daily before breakfast.    . lisinopril (ZESTRIL) 5 MG tablet Take 5 mg by mouth daily.    . palbociclib (IBRANCE) 100 MG tablet Take 1 tablet (100 mg total) by mouth daily. 21 days on and 7 days off 21 tablet 6  . triamcinolone ointment (KENALOG) 0.5 % Apply 1 application topically 2 (two) times daily. 30 g 0   No current facility-administered medications for this visit.    PHYSICAL EXAMINATION: ECOG PERFORMANCE STATUS: 0 - Asymptomatic  Vitals:   05/12/20 1355  BP: (!) 142/72  Pulse: 68  Resp: 15  Temp: 97.9 F (36.6 C)  SpO2: 100%   Filed Weights   05/12/20 1355  Weight: 154 lb 9.6 oz (70.1 kg)     LABORATORY DATA:  I have reviewed the data as listed CMP Latest Ref Rng & Units 05/12/2020 05/04/2020 04/13/2020  Glucose 70 - 99  mg/dL 98 97 100(H)  BUN 6 - 20 mg/dL _0 Creatinine 0.44 - 1.00 mg/dL 1.00 0.85 0.99  Sodium 135 - 145 mmol/L 142 142 140  Potassium 3.5 - 5.1 mmol/L 4.0 4.0 4.2  Chloride 98 - 111 mmol/L 106 107 106  CO2 22 - 32 mmol/L _1 Calcium 8.9 - 10.3 mg/dL 9.0 8.7(L) 9.0  Total Protein 6.5 - 8.1 g/dL 6.6 6.7 6.6  Total Bilirubin 0.3 - 1.2 mg/dL 0.6 0.6 0.4  Alkaline Phos 38 - 126 U/L 47 49 51  AST 15 - 41 U/L 74(H) 38 23  ALT 0 - 44 U/L 121(H) 87(H) 28    Lab Results  Component Value Date    WBC 3.7 (L) 05/12/2020   HGB 12.8 05/12/2020   HCT 38.1 05/12/2020   MCV 100.8 (H) 05/12/2020   PLT 279 05/12/2020   NEUTROABS 1.4 (L) 05/12/2020    ASSESSMENT & PLAN:  Metastatic breast cancer ([place]) Right breastcancer in 2010 for which she underwent a right mastectomy with reconstruction in 2011.  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   Recurrence: Chest CT on 02/11/20 showed left anterior chest mass 6.2 cm. Multiple bilateral pulmonary nodules consistent with metastatic disease.  Biopsy shows invasive ductal carcinoma, ER positive, with Ki-67 of 1%  PET scan on 03/04/2020 shows hypermetabolic activity on the lungs consistent with metastases, however less than 80m  [facility] Genomic testing: PIK 3 CA mutation present, PD-L1 positive, ER positive (no BRCA mutation) ------------------------------------------------------------------------------------------------------------------------------------------- Current Treatment:Ibrance with anastrozole  IbranceToxicities: 1.Diarrhea: Patient is managing without taking Imodium. 2.fatigue: Moderate 3.Hot flashes: Wake her up at night 4.Decreased sleep: Not interested in any sleep aids 5.ANC today 1.4.    Current dosage: 100 mg. 6.  Elevated LFTs: Unclear etiology could be related to Ibrance or anastrozole.  I discussed with her about stopping alcohol intake and rechecking labs in 2 weeks.  CT CAP 05/10/2020: Unchanged chest wall tumor 4.4 x 4 cm previously was 4.3 x 4 cm no new pathologically enlarged lymph nodes.  Numerous lung nodules largest 7 mm all stable.  No new lung nodules.  Counseling: I discussed with the patient the results of the CT scan showing stable disease.  We discussed the pros and cons of doing palliative radiation to the tumor to  affect shrinkage. We decided to hold off on radiation at this time.  Return to clinic in 2 weeks to recheck the liver function tests. Otherwise I will see the patient back with labs and follow-up in 4 weeks.    Orders Placed This Encounter  Procedures  . CBC with Differential ([facility] Only)    Standing Status:   Future    Standing Expiration Date:   05/12/2021  . CMP ([facility])    Standing Status:   Future    Standing Expiration Date:   05/12/2021   The patient has a good understanding of the overall plan. she agrees with it. she will call with any problems that may develop before the next visit here.  Total time spent: 30 mins including face to face time and time spent for planning, charting and coordination of care  VRulon Eisenmenger MD, MPH 05/12/2020  I, Molly Dorshimer, am acting as scribe for Dr. VNicholas Lose  I have reviewed the above documentation for  accuracy and completeness, and I agree with the above.       

## 2020-05-12 ENCOUNTER — Inpatient Hospital Stay: Payer: BC Managed Care – PPO

## 2020-05-12 ENCOUNTER — Other Ambulatory Visit: Payer: Self-pay

## 2020-05-12 ENCOUNTER — Inpatient Hospital Stay: Payer: BC Managed Care – PPO | Admitting: Hematology and Oncology

## 2020-05-12 DIAGNOSIS — C50919 Malignant neoplasm of unspecified site of unspecified female breast: Secondary | ICD-10-CM

## 2020-05-12 DIAGNOSIS — C50812 Malignant neoplasm of overlapping sites of left female breast: Secondary | ICD-10-CM | POA: Diagnosis not present

## 2020-05-12 LAB — CMP (CANCER CENTER ONLY)
ALT: 121 U/L — ABNORMAL HIGH (ref 0–44)
AST: 74 U/L — ABNORMAL HIGH (ref 15–41)
Albumin: 4.1 g/dL (ref 3.5–5.0)
Alkaline Phosphatase: 47 U/L (ref 38–126)
Anion gap: 11 (ref 5–15)
BUN: 13 mg/dL (ref 6–20)
CO2: 25 mmol/L (ref 22–32)
Calcium: 9 mg/dL (ref 8.9–10.3)
Chloride: 106 mmol/L (ref 98–111)
Creatinine: 1 mg/dL (ref 0.44–1.00)
GFR, Estimated: >60 mL/min (ref >60)
Glucose, Bld: 98 mg/dL (ref 70–99)
Potassium: 4 mmol/L (ref 3.5–5.1)
Sodium: 142 mmol/L (ref 135–145)
Total Bilirubin: 0.6 mg/dL (ref 0.3–1.2)
Total Protein: 6.6 g/dL (ref 6.5–8.1)

## 2020-05-12 LAB — CBC WITH DIFFERENTIAL (CANCER CENTER ONLY)
Abs Immature Granulocytes: 0.01 10*3/uL (ref 0.00–0.07)
Basophils Absolute: 0.2 10*3/uL — ABNORMAL HIGH (ref 0.0–0.1)
Basophils Relative: 5 %
Eosinophils Absolute: 0.1 10*3/uL (ref 0.0–0.5)
Eosinophils Relative: 3 %
HCT: 38.1 % (ref 36.0–46.0)
Hemoglobin: 12.8 g/dL (ref 12.0–15.0)
Immature Granulocytes: 0 %
Lymphocytes Relative: 41 %
Lymphs Abs: 1.5 10*3/uL (ref 0.7–4.0)
MCH: 33.9 pg (ref 26.0–34.0)
MCHC: 33.6 g/dL (ref 30.0–36.0)
MCV: 100.8 fL — ABNORMAL HIGH (ref 80.0–100.0)
Monocytes Absolute: 0.4 10*3/uL (ref 0.1–1.0)
Monocytes Relative: 12 %
Neutro Abs: 1.4 10*3/uL — ABNORMAL LOW (ref 1.7–7.7)
Neutrophils Relative %: 39 %
Platelet Count: 279 10*3/uL (ref 150–400)
RBC: 3.78 MIL/uL — ABNORMAL LOW (ref 3.87–5.11)
RDW: 17.2 % — ABNORMAL HIGH (ref 11.5–15.5)
WBC Count: 3.7 10*3/uL — ABNORMAL LOW (ref 4.0–10.5)
nRBC: 0 % (ref 0.0–0.2)

## 2020-05-12 NOTE — Assessment & Plan Note (Signed)
Right breastcancer in 2010 for which she underwent a right mastectomy with reconstruction in 2011.  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   Recurrence: Chest CT on 02/11/20 showed left anterior chest mass 6.2 cm. Multiple bilateral pulmonary nodules consistent with metastatic disease.  Biopsy shows invasive ductal carcinoma, ER positive, with Ki-67 of 1%  PET scan on 03/04/2020 shows hypermetabolic activity on the lungs consistent with metastases, however less than 69m  [facility] Genomic testing: PIK 3 CA mutation present, PD-L1 positive, ER positive (no BRCA mutation) ------------------------------------------------------------------------------------------------------------------------------------------- Current Treatment:Ibrance with anastrozole  IbranceToxicities: 1.Diarrhea: Patient is managing without taking Imodium. 2.fatigue: Moderate 3.Hot flashes: Wake her up at night 4.Decreased sleep: Not interested in any sleep aids 5.ANC today .    Current dosage: 100 mg.  CT CAP 05/10/2020: Unchanged chest wall tumor 4.4 x 4 cm previously was 4.3 x 4 cm no new pathologically enlarged lymph nodes.  Numerous lung nodules largest 7 mm all stable.  No new lung nodules.  Counseling: I discussed with the patient the results of the CT scan showing stable disease.  We discussed the pros and cons of doing palliative radiation to the tumor to affect shrinkage.

## 2020-05-26 ENCOUNTER — Inpatient Hospital Stay: Payer: BC Managed Care – PPO

## 2020-05-26 ENCOUNTER — Other Ambulatory Visit: Payer: Self-pay

## 2020-05-26 DIAGNOSIS — C50812 Malignant neoplasm of overlapping sites of left female breast: Secondary | ICD-10-CM | POA: Diagnosis not present

## 2020-05-26 DIAGNOSIS — C50919 Malignant neoplasm of unspecified site of unspecified female breast: Secondary | ICD-10-CM

## 2020-05-26 LAB — CBC WITH DIFFERENTIAL (CANCER CENTER ONLY)
Abs Immature Granulocytes: 0 10*3/uL (ref 0.00–0.07)
Basophils Absolute: 0.1 10*3/uL (ref 0.0–0.1)
Basophils Relative: 3 %
Eosinophils Absolute: 0.2 10*3/uL (ref 0.0–0.5)
Eosinophils Relative: 6 %
HCT: 39.7 % (ref 36.0–46.0)
Hemoglobin: 13.1 g/dL (ref 12.0–15.0)
Immature Granulocytes: 0 %
Lymphocytes Relative: 49 %
Lymphs Abs: 1.5 10*3/uL (ref 0.7–4.0)
MCH: 33.9 pg (ref 26.0–34.0)
MCHC: 33 g/dL (ref 30.0–36.0)
MCV: 102.8 fL — ABNORMAL HIGH (ref 80.0–100.0)
Monocytes Absolute: 0.2 10*3/uL (ref 0.1–1.0)
Monocytes Relative: 7 %
Neutro Abs: 1 10*3/uL — ABNORMAL LOW (ref 1.7–7.7)
Neutrophils Relative %: 35 %
Platelet Count: 186 10*3/uL (ref 150–400)
RBC: 3.86 MIL/uL — ABNORMAL LOW (ref 3.87–5.11)
RDW: 16.1 % — ABNORMAL HIGH (ref 11.5–15.5)
WBC Count: 3 10*3/uL — ABNORMAL LOW (ref 4.0–10.5)
nRBC: 0 % (ref 0.0–0.2)

## 2020-05-26 LAB — CMP (CANCER CENTER ONLY)
ALT: 83 U/L — ABNORMAL HIGH (ref 0–44)
AST: 34 U/L (ref 15–41)
Albumin: 4.2 g/dL (ref 3.5–5.0)
Alkaline Phosphatase: 44 U/L (ref 38–126)
Anion gap: 8 (ref 5–15)
BUN: 12 mg/dL (ref 6–20)
CO2: 27 mmol/L (ref 22–32)
Calcium: 9.2 mg/dL (ref 8.9–10.3)
Chloride: 107 mmol/L (ref 98–111)
Creatinine: 0.95 mg/dL (ref 0.44–1.00)
GFR, Estimated: >60 mL/min (ref >60)
Glucose, Bld: 83 mg/dL (ref 70–99)
Potassium: 4.3 mmol/L (ref 3.5–5.1)
Sodium: 142 mmol/L (ref 135–145)
Total Bilirubin: 0.9 mg/dL (ref 0.3–1.2)
Total Protein: 6.6 g/dL (ref 6.5–8.1)

## 2020-05-27 ENCOUNTER — Other Ambulatory Visit (HOSPITAL_COMMUNITY): Payer: Self-pay

## 2020-05-28 ENCOUNTER — Encounter: Payer: Self-pay | Admitting: Adult Health

## 2020-06-03 ENCOUNTER — Other Ambulatory Visit (HOSPITAL_COMMUNITY): Payer: Self-pay

## 2020-06-08 ENCOUNTER — Encounter: Payer: Self-pay | Admitting: Adult Health

## 2020-06-08 NOTE — Progress Notes (Incomplete)
Patient Care Team: Ephriam Jenkins, PA as PCP - General (Physician Assistant)  DIAGNOSIS: No diagnosis found.  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 CT chest 02/10/2018 Heterogeneous mass left anterior chest bordering the sternum 6.2 x 4 x 4.2 cm.  Multiple bilateral lung nodules LUL: 5 mm, LLL: 7 mm, 2 adjacent nodules LLL: 5 mm, LUL lingula: 6 mm, RML: 5 mm, hazy linear reticulonodular opacities, 4 low-density liver masses 3 cm likely cysts   03/25/2020 Miscellaneous   [facility] molecular testing: PI K3 CA mutation present, ER positive, PD-L1 positive   03/30/2020 Cancer Staging   Staging form: Breast, AJCC 8th Edition - Clinical: Stage IV (rcTX, cNX, cM1, G2, ER+, PR+, HER2-) - Signed by Nicholas Lose, MD on 03/30/2020 Stage prefix: Recurrence     CHIEF COMPLIANT: Follow-up of metastatic breast cancer on Ibrance  INTERVAL HISTORY: Melanie Davis is a 60 y.o. with above-mentioned history of metastatic breast cancer currently on treatment with Ibrance.She presents to the clinic todayfor follow-up.   ALLERGIES:  has No Known Allergies.  MEDICATIONS:  Current Outpatient Medications  Medication Sig Dispense Refill  . albuterol (VENTOLIN HFA) 108 (90 Base) MCG/ACT inhaler Inhale 1 puff into the lungs every 4 (four) hours as needed for wheezing or shortness of breath.    . anastrozole (ARIMIDEX) 1 MG tablet Take 1 tablet (1 mg total) by mouth daily. 90 tablet 3  .  beclomethasone (QVAR) 40 MCG/ACT inhaler Inhale into the lungs 2 (two) times daily.    . fluticasone (FLONASE) 50 MCG/ACT nasal spray Place 1 spray into both nostrils daily as needed for allergies or rhinitis.    Marland Kitchen levothyroxine (SYNTHROID) 100 MCG tablet Take 100 mcg by mouth daily before breakfast.    . lisinopril (ZESTRIL) 5 MG tablet Take 5 mg by mouth daily.    . palbociclib (IBRANCE) 100 MG tablet Take 1 tablet (100 mg total) by mouth daily. 21 days on and 7 days off 21 tablet 6  . triamcinolone ointment (KENALOG) 0.5 % Apply 1 application topically 2 (two) times daily. 30 g 0   No current facility-administered medications for this visit.    PHYSICAL EXAMINATION: ECOG PERFORMANCE STATUS: {CHL ONC ECOG PS:[id number]}  There were no vitals filed for this visit. There were no vitals filed for this visit.  LABORATORY DATA:  I have reviewed the data as listed CMP Latest Ref Rng & Units 05/26/2020 05/12/2020 05/04/2020  Glucose 70 - 99 mg/dL 83 98 97  BUN 6 - 20 mg/dL '12 13 13  ' Creatinine 0.44 - 1.00 mg/dL 0.95 1.00 0.85  Sodium 135 - 145 mmol/L 142 142 142  Potassium 3.5 - 5.1 mmol/L 4.3 4.0 4.0  Chloride 98 - 111 mmol/L 107 106 107  CO2 22 - 32 mmol/L '27 25 23  ' Calcium 8.9 - 10.3 mg/dL 9.2 9.0 8.7(L)  Total Protein 6.5 - 8.1 g/dL 6.6 6.6 6.7  Total Bilirubin 0.3 - 1.2 mg/dL 0.9 0.6 0.6  Alkaline Phos 38 - 126 U/L 44 47 49  AST 15 - 41 U/L 34 74(H) 38  ALT 0 - 44 U/L 83(H) 121(H) 87(H)    Lab Results  Component Value Date   WBC 3.0 (L) 05/26/2020   HGB 13.1 05/26/2020   HCT 39.7 05/26/2020   MCV 102.8 (H) 05/26/2020   PLT 186 05/26/2020   NEUTROABS 1.0 (L) 05/26/2020    ASSESSMENT & PLAN:  No problem-specific Assessment & Plan notes found for this encounter.    No orders of the defined types were placed in this encounter.  The patient has a good understanding of the overall plan. she agrees with it. she will call with any problems that may develop before the next visit  here.  Total time spent: *** mins including face to face time and time spent for planning, charting and coordination of care  Rulon Eisenmenger, MD, MPH 06/08/2020  I, Cloyde Reams Dorshimer, am acting as scribe for Dr. Nicholas Lose.  {insert scribe attestation}

## 2020-06-09 ENCOUNTER — Inpatient Hospital Stay: Payer: BC Managed Care – PPO

## 2020-06-09 ENCOUNTER — Inpatient Hospital Stay: Payer: BC Managed Care – PPO | Admitting: Hematology and Oncology

## 2020-06-09 NOTE — Assessment & Plan Note (Deleted)
Right breastcancer in 2010 for which she underwent a right mastectomy with reconstruction in 2011.  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   Recurrence: Chest CT on 02/11/20 showed left anterior chest mass 6.2 cm. Multiple bilateral pulmonary nodules consistent with metastatic disease.  Biopsy shows invasive ductal carcinoma, ER positive, with Ki-67 of 1%  PET scan on 03/04/2020 shows hypermetabolic activity on the lungs consistent with metastases, however less than 8mm  [facility] Genomic testing: PIK 3 CA mutation present, PD-L1 positive, ER positive (no BRCA mutation) ------------------------------------------------------------------------------------------------------------------------------------------- Current Treatment:Ibrance with anastrozole  IbranceToxicities: 1.Diarrhea:Patient is managing without taking Imodium. 2.fatigue:Moderate 3.Hot flashes: Wake her up at night 4.Decreased sleep:Not interested in any sleep aids 5.ANC today 1.4.  Current dosage: 100 mg. 6.  Elevated LFTs: Unclear etiology could be related to Ibrance or anastrozole.  I discussed with her about stopping alcohol intake and rechecking labs in 2 weeks.  CT CAP 05/10/2020: Unchanged chest wall tumor 4.4 x 4 cm previously was 4.3 x 4 cm no new pathologically enlarged lymph nodes.  Numerous lung nodules largest 7 mm all stable.  No new lung nodules. 

## 2020-06-23 ENCOUNTER — Encounter: Payer: Self-pay | Admitting: Hematology and Oncology

## 2020-06-30 ENCOUNTER — Other Ambulatory Visit (HOSPITAL_COMMUNITY): Payer: Self-pay

## 2020-07-07 ENCOUNTER — Other Ambulatory Visit (HOSPITAL_COMMUNITY): Payer: Self-pay

## 2020-07-07 MED ORDER — IBRANCE 75 MG PO TABS
ORAL_TABLET | ORAL | 2 refills | Status: DC
  Filled 2020-07-07: qty 21, 28d supply, fill #0
  Filled 2020-08-05: qty 21, 28d supply, fill #1
  Filled 2020-08-25: qty 21, 28d supply, fill #2

## 2020-07-08 ENCOUNTER — Other Ambulatory Visit (HOSPITAL_COMMUNITY): Payer: Self-pay

## 2020-07-27 ENCOUNTER — Other Ambulatory Visit (HOSPITAL_COMMUNITY): Payer: Self-pay

## 2020-08-05 ENCOUNTER — Other Ambulatory Visit (HOSPITAL_COMMUNITY): Payer: Self-pay

## 2020-08-25 ENCOUNTER — Other Ambulatory Visit (HOSPITAL_COMMUNITY): Payer: Self-pay

## 2020-08-28 ENCOUNTER — Other Ambulatory Visit (HOSPITAL_COMMUNITY): Payer: Self-pay

## 2020-09-01 ENCOUNTER — Other Ambulatory Visit (HOSPITAL_COMMUNITY): Payer: Self-pay

## 2020-09-07 ENCOUNTER — Other Ambulatory Visit (HOSPITAL_COMMUNITY): Payer: Self-pay

## 2020-09-30 ENCOUNTER — Other Ambulatory Visit (HOSPITAL_COMMUNITY): Payer: Self-pay

## 2020-09-30 MED ORDER — PALBOCICLIB 75 MG PO TABS
ORAL_TABLET | ORAL | 2 refills | Status: DC
  Filled 2020-09-30: qty 21, 28d supply, fill #0
  Filled 2020-11-25: qty 21, 28d supply, fill #1
  Filled 2020-12-29 – 2021-01-18 (×2): qty 21, 28d supply, fill #2

## 2020-10-01 ENCOUNTER — Other Ambulatory Visit (HOSPITAL_COMMUNITY): Payer: Self-pay

## 2020-10-06 ENCOUNTER — Other Ambulatory Visit (HOSPITAL_COMMUNITY): Payer: Self-pay

## 2020-11-03 ENCOUNTER — Other Ambulatory Visit (HOSPITAL_COMMUNITY): Payer: Self-pay

## 2020-11-16 ENCOUNTER — Other Ambulatory Visit (HOSPITAL_COMMUNITY): Payer: Self-pay

## 2020-11-25 ENCOUNTER — Other Ambulatory Visit (HOSPITAL_COMMUNITY): Payer: Self-pay

## 2020-12-07 ENCOUNTER — Other Ambulatory Visit (HOSPITAL_COMMUNITY): Payer: Self-pay

## 2020-12-29 ENCOUNTER — Other Ambulatory Visit (HOSPITAL_COMMUNITY): Payer: Self-pay

## 2021-01-05 ENCOUNTER — Other Ambulatory Visit (HOSPITAL_COMMUNITY): Payer: Self-pay

## 2021-01-08 ENCOUNTER — Other Ambulatory Visit (HOSPITAL_COMMUNITY): Payer: Self-pay

## 2021-01-11 ENCOUNTER — Other Ambulatory Visit (HOSPITAL_COMMUNITY): Payer: Self-pay

## 2021-01-18 ENCOUNTER — Other Ambulatory Visit (HOSPITAL_COMMUNITY): Payer: Self-pay

## 2021-02-09 ENCOUNTER — Other Ambulatory Visit (HOSPITAL_COMMUNITY): Payer: Self-pay

## 2021-02-09 MED ORDER — PALBOCICLIB 75 MG PO TABS
ORAL_TABLET | ORAL | 2 refills | Status: DC
  Filled 2021-02-09: qty 21, 28d supply, fill #0
  Filled 2021-05-03: qty 21, 28d supply, fill #1
  Filled 2021-05-31: qty 21, 28d supply, fill #2

## 2021-02-26 ENCOUNTER — Other Ambulatory Visit (HOSPITAL_COMMUNITY): Payer: Self-pay

## 2021-03-19 ENCOUNTER — Other Ambulatory Visit (HOSPITAL_COMMUNITY): Payer: Self-pay

## 2021-04-07 ENCOUNTER — Other Ambulatory Visit (HOSPITAL_COMMUNITY): Payer: Self-pay

## 2021-05-03 ENCOUNTER — Other Ambulatory Visit (HOSPITAL_COMMUNITY): Payer: Self-pay

## 2021-05-11 ENCOUNTER — Other Ambulatory Visit (HOSPITAL_COMMUNITY): Payer: Self-pay

## 2021-05-31 ENCOUNTER — Other Ambulatory Visit (HOSPITAL_COMMUNITY): Payer: Self-pay

## 2021-06-10 ENCOUNTER — Other Ambulatory Visit (HOSPITAL_COMMUNITY): Payer: Self-pay

## 2021-06-17 ENCOUNTER — Other Ambulatory Visit (HOSPITAL_COMMUNITY): Payer: Self-pay

## 2021-06-22 ENCOUNTER — Other Ambulatory Visit (HOSPITAL_COMMUNITY): Payer: Self-pay

## 2021-07-09 ENCOUNTER — Other Ambulatory Visit (HOSPITAL_COMMUNITY): Payer: Self-pay

## 2021-07-09 MED ORDER — DULOXETINE HCL 20 MG PO CPEP
ORAL_CAPSULE | ORAL | 0 refills | Status: DC
  Filled 2021-07-09: qty 30, 30d supply, fill #0

## 2021-07-13 ENCOUNTER — Other Ambulatory Visit (HOSPITAL_COMMUNITY): Payer: Self-pay

## 2021-07-13 MED ORDER — PALBOCICLIB 75 MG PO TABS
ORAL_TABLET | ORAL | 2 refills | Status: DC
  Filled 2021-07-13: qty 21, 28d supply, fill #0
  Filled 2021-10-11 – 2022-02-06 (×2): qty 21, fill #0
  Filled 2022-02-25 – 2022-06-01 (×2): qty 21, 28d supply, fill #0
  Filled 2022-07-12: qty 21, 28d supply, fill #1

## 2021-07-14 ENCOUNTER — Other Ambulatory Visit (HOSPITAL_COMMUNITY): Payer: Self-pay

## 2021-07-14 MED ORDER — IBRANCE 75 MG PO TABS
ORAL_TABLET | ORAL | 3 refills | Status: DC
  Filled 2021-07-14: qty 21, 28d supply, fill #0
  Filled 2021-08-19: qty 21, 28d supply, fill #1
  Filled 2021-10-14: qty 21, 28d supply, fill #2
  Filled 2021-11-10: qty 21, 28d supply, fill #3

## 2021-07-27 ENCOUNTER — Other Ambulatory Visit (HOSPITAL_COMMUNITY): Payer: Self-pay

## 2021-07-28 ENCOUNTER — Other Ambulatory Visit (HOSPITAL_COMMUNITY): Payer: Self-pay

## 2021-08-05 ENCOUNTER — Other Ambulatory Visit (HOSPITAL_COMMUNITY): Payer: Self-pay

## 2021-08-05 MED ORDER — TRAZODONE HCL 50 MG PO TABS
50.0000 mg | ORAL_TABLET | Freq: Every day | ORAL | 1 refills | Status: DC
  Filled 2021-08-05: qty 90, 90d supply, fill #0
  Filled 2021-12-01: qty 90, 90d supply, fill #1

## 2021-08-19 ENCOUNTER — Other Ambulatory Visit (HOSPITAL_COMMUNITY): Payer: Self-pay

## 2021-09-07 ENCOUNTER — Other Ambulatory Visit (HOSPITAL_COMMUNITY): Payer: Self-pay

## 2021-09-13 ENCOUNTER — Other Ambulatory Visit (HOSPITAL_COMMUNITY): Payer: Self-pay

## 2021-09-13 MED ORDER — DULOXETINE HCL 20 MG PO CPEP
ORAL_CAPSULE | ORAL | 3 refills | Status: DC
  Filled 2021-09-13: qty 30, 30d supply, fill #0
  Filled 2021-10-11: qty 30, 30d supply, fill #1
  Filled 2021-11-12: qty 30, 30d supply, fill #2
  Filled 2022-02-06: qty 30, 30d supply, fill #3

## 2021-09-14 ENCOUNTER — Other Ambulatory Visit (HOSPITAL_COMMUNITY): Payer: Self-pay

## 2021-09-24 ENCOUNTER — Other Ambulatory Visit (HOSPITAL_COMMUNITY): Payer: Self-pay

## 2021-10-11 ENCOUNTER — Other Ambulatory Visit (HOSPITAL_COMMUNITY): Payer: Self-pay

## 2021-10-14 ENCOUNTER — Other Ambulatory Visit (HOSPITAL_COMMUNITY): Payer: Self-pay

## 2021-10-25 ENCOUNTER — Other Ambulatory Visit (HOSPITAL_COMMUNITY): Payer: Self-pay

## 2021-11-10 ENCOUNTER — Other Ambulatory Visit (HOSPITAL_COMMUNITY): Payer: Self-pay

## 2021-11-12 ENCOUNTER — Other Ambulatory Visit (HOSPITAL_COMMUNITY): Payer: Self-pay

## 2021-11-22 ENCOUNTER — Other Ambulatory Visit (HOSPITAL_COMMUNITY): Payer: Self-pay

## 2021-12-01 ENCOUNTER — Other Ambulatory Visit (HOSPITAL_COMMUNITY): Payer: Self-pay

## 2021-12-09 ENCOUNTER — Other Ambulatory Visit (HOSPITAL_COMMUNITY): Payer: Self-pay

## 2021-12-13 ENCOUNTER — Other Ambulatory Visit (HOSPITAL_COMMUNITY): Payer: Self-pay

## 2021-12-15 ENCOUNTER — Other Ambulatory Visit (HOSPITAL_COMMUNITY): Payer: Self-pay

## 2021-12-16 MED ORDER — PALBOCICLIB 75 MG PO TABS
ORAL_TABLET | ORAL | 3 refills | Status: DC
  Filled 2021-12-16: qty 21, 28d supply, fill #0
  Filled 2022-02-06 – 2022-02-07 (×2): qty 21, 28d supply, fill #1
  Filled 2022-03-17: qty 21, 28d supply, fill #2
  Filled 2022-04-22: qty 21, 28d supply, fill #3

## 2021-12-17 ENCOUNTER — Other Ambulatory Visit (HOSPITAL_COMMUNITY): Payer: Self-pay

## 2022-01-03 ENCOUNTER — Other Ambulatory Visit (HOSPITAL_COMMUNITY): Payer: Self-pay

## 2022-01-10 ENCOUNTER — Other Ambulatory Visit (HOSPITAL_COMMUNITY): Payer: Self-pay

## 2022-01-10 MED ORDER — TRAZODONE HCL 50 MG PO TABS
50.0000 mg | ORAL_TABLET | Freq: Every day | ORAL | 1 refills | Status: DC
  Filled 2022-02-06 – 2022-02-25 (×2): qty 90, 90d supply, fill #0

## 2022-01-10 MED ORDER — DULOXETINE HCL 20 MG PO CPEP
20.0000 mg | ORAL_CAPSULE | Freq: Every day | ORAL | 3 refills | Status: AC
  Filled 2022-01-10: qty 30, 30d supply, fill #0
  Filled 2022-02-06 – 2022-02-25 (×2): qty 30, 30d supply, fill #1

## 2022-01-19 ENCOUNTER — Other Ambulatory Visit (HOSPITAL_COMMUNITY): Payer: Self-pay

## 2022-01-20 ENCOUNTER — Other Ambulatory Visit (HOSPITAL_COMMUNITY): Payer: Self-pay

## 2022-02-07 ENCOUNTER — Other Ambulatory Visit: Payer: Self-pay

## 2022-02-07 ENCOUNTER — Other Ambulatory Visit (HOSPITAL_COMMUNITY): Payer: Self-pay

## 2022-02-08 ENCOUNTER — Other Ambulatory Visit (HOSPITAL_COMMUNITY): Payer: Self-pay

## 2022-02-24 ENCOUNTER — Other Ambulatory Visit (HOSPITAL_COMMUNITY): Payer: Self-pay

## 2022-02-25 ENCOUNTER — Other Ambulatory Visit: Payer: Self-pay

## 2022-02-25 ENCOUNTER — Other Ambulatory Visit (HOSPITAL_COMMUNITY): Payer: Self-pay

## 2022-03-01 ENCOUNTER — Other Ambulatory Visit: Payer: Self-pay

## 2022-03-16 ENCOUNTER — Other Ambulatory Visit (HOSPITAL_COMMUNITY): Payer: Self-pay

## 2022-03-17 ENCOUNTER — Other Ambulatory Visit (HOSPITAL_COMMUNITY): Payer: Self-pay

## 2022-03-24 ENCOUNTER — Other Ambulatory Visit (HOSPITAL_COMMUNITY): Payer: Self-pay

## 2022-04-05 ENCOUNTER — Other Ambulatory Visit (HOSPITAL_COMMUNITY): Payer: Self-pay

## 2022-04-21 ENCOUNTER — Other Ambulatory Visit (HOSPITAL_COMMUNITY): Payer: Self-pay

## 2022-04-22 ENCOUNTER — Other Ambulatory Visit (HOSPITAL_COMMUNITY): Payer: Self-pay

## 2022-04-25 ENCOUNTER — Other Ambulatory Visit (HOSPITAL_COMMUNITY): Payer: Self-pay

## 2022-04-28 ENCOUNTER — Other Ambulatory Visit: Payer: Self-pay

## 2022-04-28 ENCOUNTER — Other Ambulatory Visit (HOSPITAL_COMMUNITY): Payer: Self-pay

## 2022-05-04 ENCOUNTER — Other Ambulatory Visit (HOSPITAL_COMMUNITY): Payer: Self-pay

## 2022-05-04 MED ORDER — TRAZODONE HCL 50 MG PO TABS
50.0000 mg | ORAL_TABLET | Freq: Every evening | ORAL | 1 refills | Status: DC
  Filled 2022-05-04: qty 90, 30d supply, fill #0
  Filled 2022-05-31: qty 90, 30d supply, fill #1

## 2022-05-06 ENCOUNTER — Other Ambulatory Visit (HOSPITAL_COMMUNITY): Payer: Self-pay

## 2022-05-12 ENCOUNTER — Other Ambulatory Visit (HOSPITAL_COMMUNITY): Payer: Self-pay

## 2022-06-01 ENCOUNTER — Other Ambulatory Visit: Payer: Self-pay

## 2022-06-01 ENCOUNTER — Other Ambulatory Visit (HOSPITAL_COMMUNITY): Payer: Self-pay

## 2022-06-01 MED ORDER — TRAZODONE HCL 50 MG PO TABS
75.0000 mg | ORAL_TABLET | Freq: Every evening | ORAL | 3 refills | Status: DC
  Filled 2022-06-01: qty 135, 90d supply, fill #0

## 2022-06-20 ENCOUNTER — Other Ambulatory Visit (HOSPITAL_COMMUNITY): Payer: Self-pay

## 2022-07-12 ENCOUNTER — Other Ambulatory Visit (HOSPITAL_COMMUNITY): Payer: Self-pay

## 2022-07-22 ENCOUNTER — Other Ambulatory Visit (HOSPITAL_COMMUNITY): Payer: Self-pay

## 2022-07-27 ENCOUNTER — Other Ambulatory Visit (HOSPITAL_COMMUNITY): Payer: Self-pay

## 2022-07-27 MED ORDER — PALBOCICLIB 75 MG PO TABS
ORAL_TABLET | ORAL | 2 refills | Status: DC
  Filled 2022-07-27: qty 21, 28d supply, fill #0
  Filled 2022-08-23 (×2): qty 21, 28d supply, fill #1
  Filled 2022-09-29: qty 21, 28d supply, fill #2

## 2022-07-29 ENCOUNTER — Other Ambulatory Visit (HOSPITAL_COMMUNITY): Payer: Self-pay

## 2022-08-01 ENCOUNTER — Other Ambulatory Visit (HOSPITAL_COMMUNITY): Payer: Self-pay

## 2022-08-23 ENCOUNTER — Other Ambulatory Visit (HOSPITAL_COMMUNITY): Payer: Self-pay

## 2022-09-01 ENCOUNTER — Other Ambulatory Visit (HOSPITAL_COMMUNITY): Payer: Self-pay

## 2022-09-02 ENCOUNTER — Other Ambulatory Visit: Payer: Self-pay

## 2022-09-06 ENCOUNTER — Other Ambulatory Visit (HOSPITAL_COMMUNITY): Payer: Self-pay

## 2022-09-06 MED ORDER — ALPRAZOLAM 0.5 MG PO TABS
0.5000 mg | ORAL_TABLET | Freq: Every evening | ORAL | 0 refills | Status: AC | PRN
  Filled 2022-09-06: qty 30, 30d supply, fill #0

## 2022-09-20 ENCOUNTER — Other Ambulatory Visit (HOSPITAL_COMMUNITY): Payer: Self-pay

## 2022-09-20 MED ORDER — TRAZODONE HCL 50 MG PO TABS
75.0000 mg | ORAL_TABLET | Freq: Every evening | ORAL | 3 refills | Status: DC
  Filled 2022-09-20: qty 135, 90d supply, fill #0

## 2022-09-21 ENCOUNTER — Other Ambulatory Visit (HOSPITAL_COMMUNITY): Payer: Self-pay

## 2022-09-26 ENCOUNTER — Other Ambulatory Visit: Payer: Self-pay

## 2022-09-29 ENCOUNTER — Other Ambulatory Visit: Payer: Self-pay

## 2022-10-22 ENCOUNTER — Encounter (HOSPITAL_COMMUNITY): Payer: Self-pay

## 2022-10-24 ENCOUNTER — Other Ambulatory Visit (HOSPITAL_COMMUNITY): Payer: Self-pay

## 2022-11-03 IMAGING — CT CT BIOPSY
1 of 3 series · 13 of 32 positions shown, 18 images · non-contrast
Comparison: none

INDICATION: 59-year-old female referred for biopsy of left chest wall mass,
history of breast carcinoma

[Series 2: i-spiral 5.0 b40f · axial · 0.65mm/px · z∈[+1175,+1343]mm · 13 of 54 slices shown, 18 images]
[im 3/54  soft-tissue]
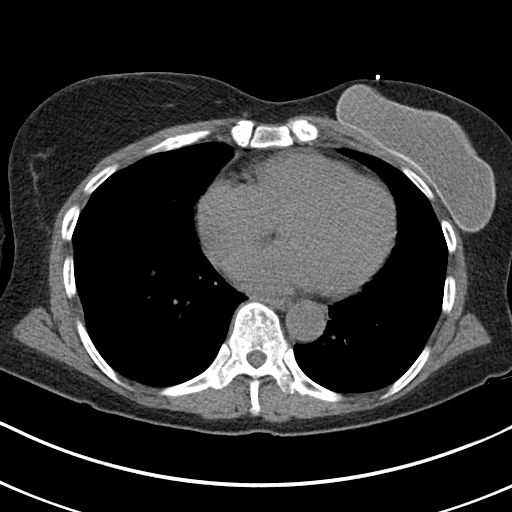
[im 3/54  bone]
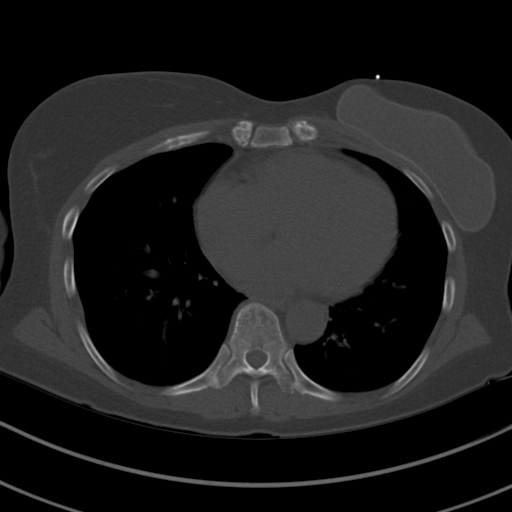
[im 9/54  soft-tissue]
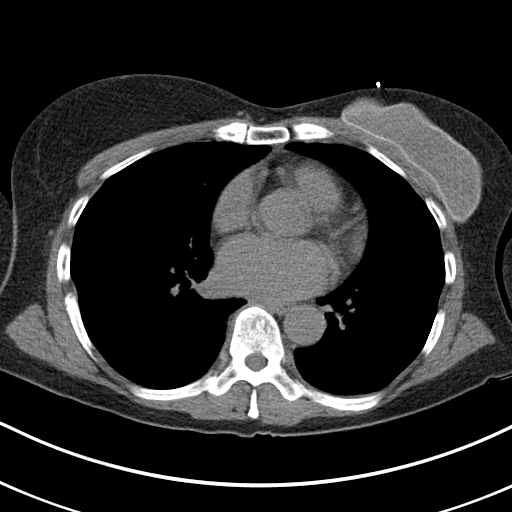
[im 12/54  soft-tissue]
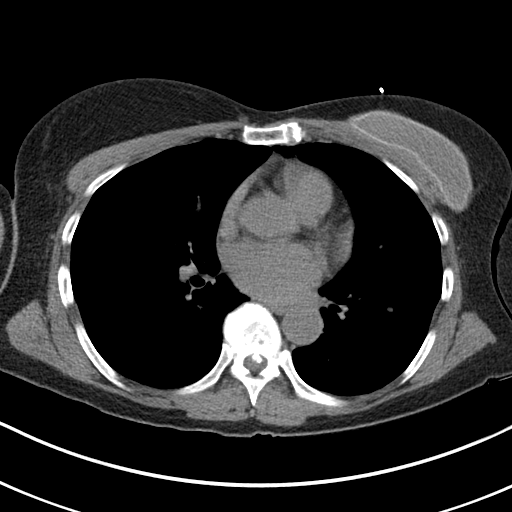
[im 15/54  soft-tissue]
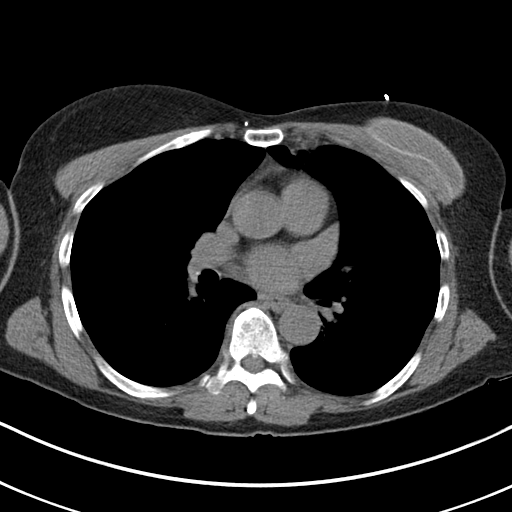
[im 21/54  soft-tissue]
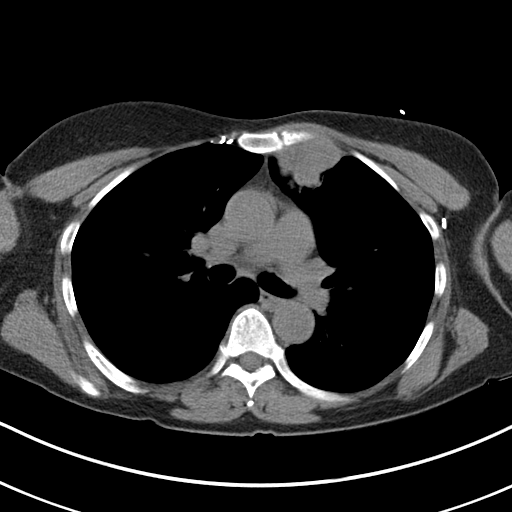
[im 24/54  soft-tissue]
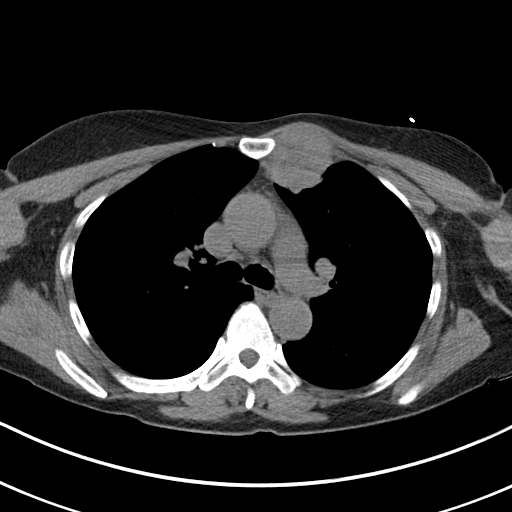
[im 30/54  soft-tissue]
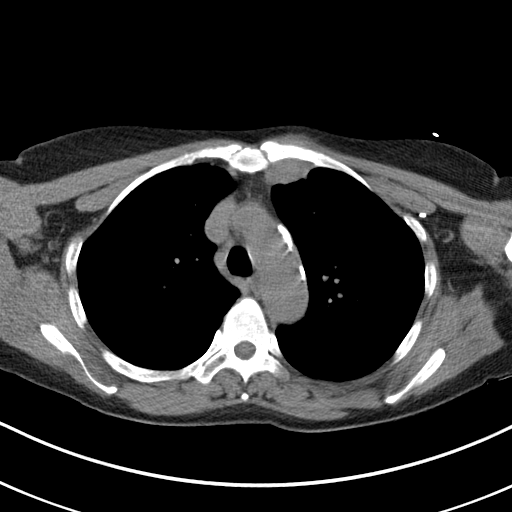
[im 33/54  soft-tissue]
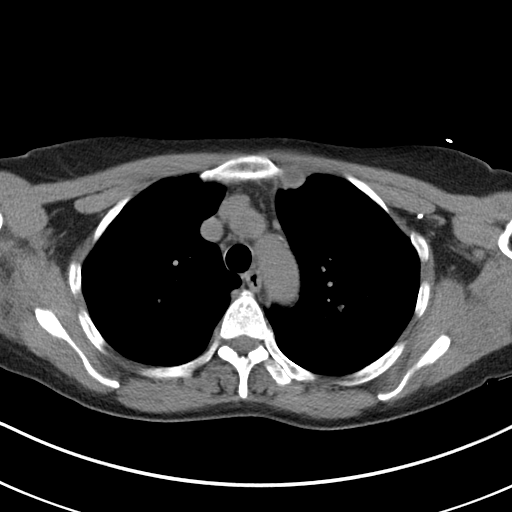
[im 39/54  soft-tissue]
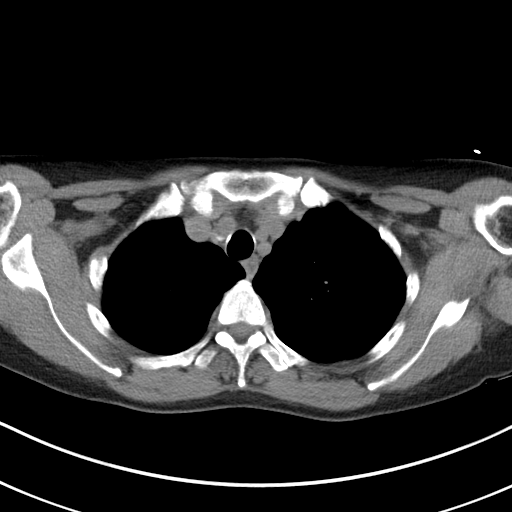
[im 39/54  bone]
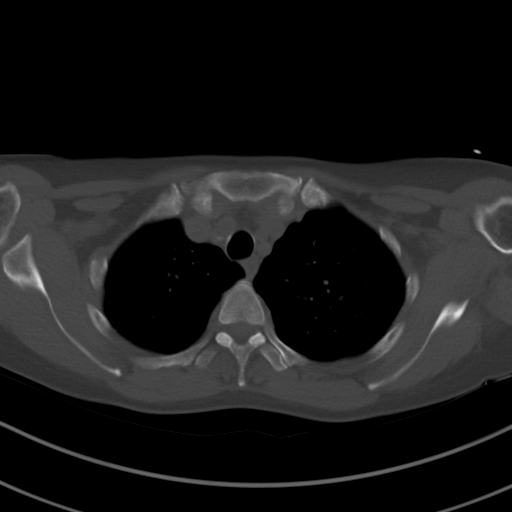
[im 42/54  soft-tissue]
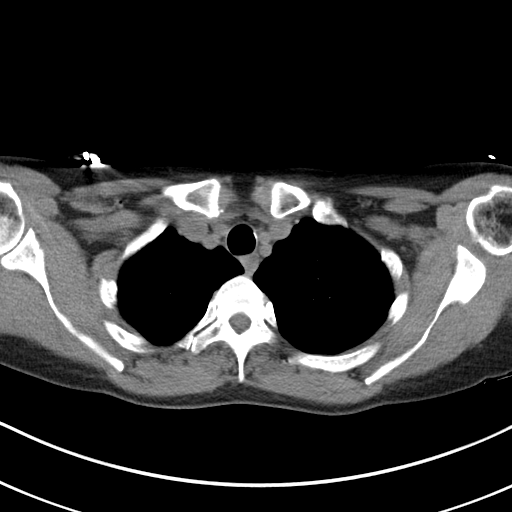
[im 42/54  lung]
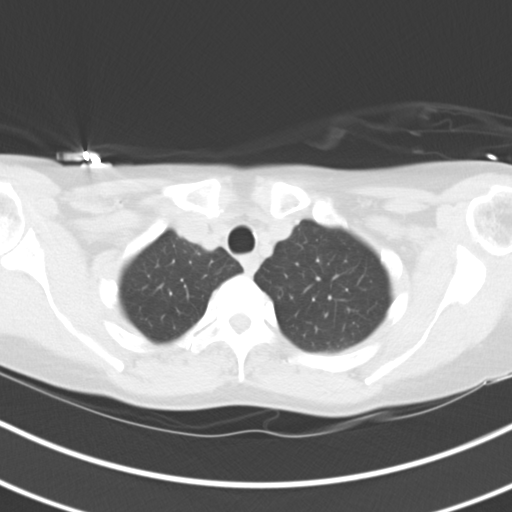
[im 45/54  soft-tissue]
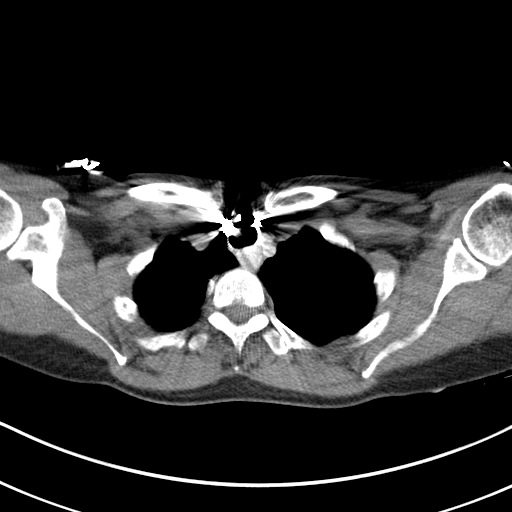
[im 45/54  lung]
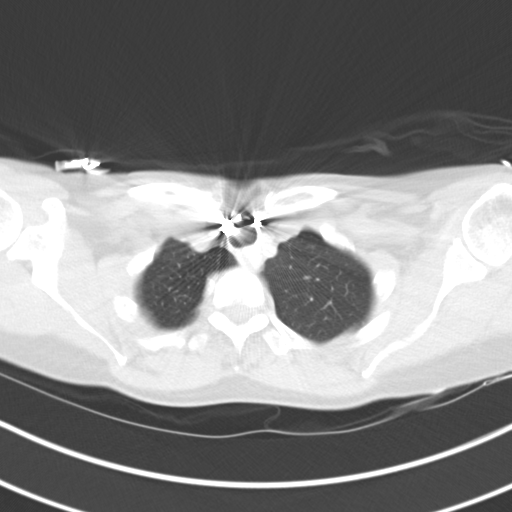
[im 48/54  lung]
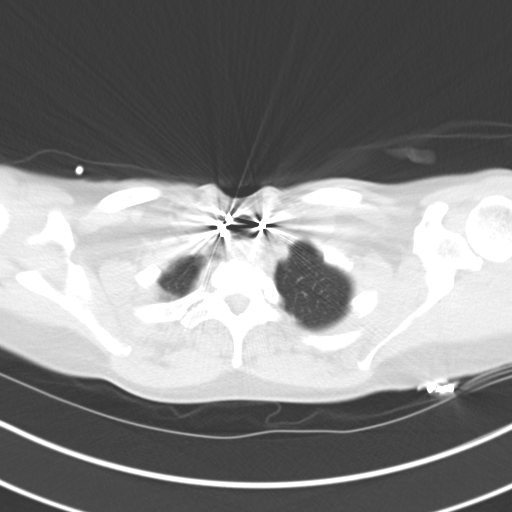
[im 51/54  soft-tissue]
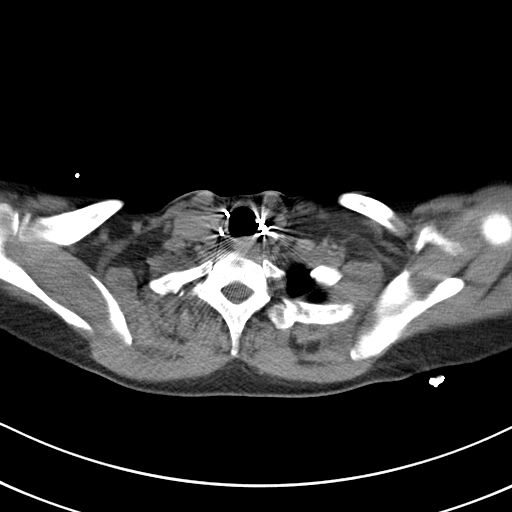
[im 51/54  lung]
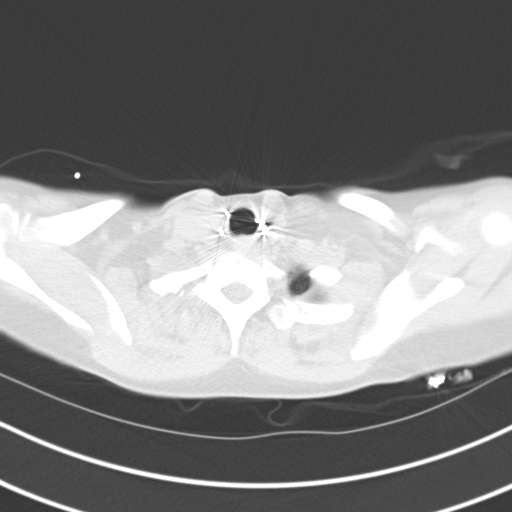

[13 of 32 positions shown; findings below may reference images not displayed]

EXAM:
CT BIOPSY

MEDICATIONS:
None.

ANESTHESIA/SEDATION:
Moderate (conscious) sedation was employed during this procedure. A
total of Versed 1.5 mg and Fentanyl 75 mcg was administered
intravenously.

Moderate Sedation Time: 13 minutes. The patient's level of
consciousness and vital signs were monitored continuously by
radiology nursing throughout the procedure under my direct
supervision.

FLUOROSCOPY TIME:  CT

COMPLICATIONS:
None

PROCEDURE:
The procedure, risks, benefits, and alternatives were explained to
the patient and the patient's family. Specific risks that were
addressed included bleeding, infection, pneumothorax, need for
further procedure including chest tube placement, chance of delayed
pneumothorax or hemorrhage, hemoptysis, nondiagnostic sample,
cardiopulmonary collapse, death. Questions regarding the procedure
were encouraged and answered. The patient understands and consents
to the procedure.

Patient was positioned in the supine position on the CT gantry table
and a scout CT of the chest was performed for planning purposes.

Once angle of approach was determined, the skin and subcutaneous
tissues this scan was prepped and draped in the usual sterile
fashion, and a sterile drape was applied covering the operative
field. A sterile gown and sterile gloves were used for the
procedure. Local anesthesia was provided with 1% Lidocaine.

The skin and subcutaneous tissues were infiltrated 1% lidocaine for
local anesthesia.

Using CT guidance, a 17 gauge trocar needle was advanced into the
parasternal, anterior chest walltarget. After confirmation of the
tip, separate 18 gauge core biopsies were performed. These were
placed into formalin solution for transportation to the lab.

A final CT image was performed.

Patient tolerated the procedure well and remained hemodynamically
stable throughout.

No complications were encountered and no significant blood loss was
encounter
IMPRESSION: Status post CT-guided biopsy of left anterior chest wall mass.

## 2022-11-15 ENCOUNTER — Other Ambulatory Visit: Payer: Self-pay

## 2022-11-15 MED ORDER — PALBOCICLIB 75 MG PO TABS
ORAL_TABLET | ORAL | 2 refills | Status: DC
  Filled 2022-11-15: qty 21, 28d supply, fill #0

## 2022-11-15 NOTE — Progress Notes (Signed)
Specialty Pharmacy Refill Coordination Note  Melanie Davis is a 62 y.o. female contacted today regarding refills of specialty medication(s) Palbociclib   Patient requested Melanie Davis at [facility] at [place] date: 12/09/22   Medication will be filled on 11/07/22.   Refill request pending approval. Please notify patient of any delays.

## 2022-11-16 ENCOUNTER — Other Ambulatory Visit: Payer: Self-pay

## 2022-11-24 ENCOUNTER — Other Ambulatory Visit: Payer: Self-pay

## 2022-12-09 ENCOUNTER — Other Ambulatory Visit (HOSPITAL_COMMUNITY): Payer: Self-pay

## 2022-12-28 ENCOUNTER — Other Ambulatory Visit (HOSPITAL_COMMUNITY): Payer: Self-pay

## 2022-12-28 ENCOUNTER — Other Ambulatory Visit: Payer: Self-pay

## 2023-01-03 ENCOUNTER — Other Ambulatory Visit (HOSPITAL_COMMUNITY): Payer: Self-pay

## 2023-01-03 MED ORDER — TRAZODONE HCL 50 MG PO TABS
75.0000 mg | ORAL_TABLET | Freq: Every evening | ORAL | 3 refills | Status: DC
  Filled 2023-01-03: qty 135, 90d supply, fill #0

## 2023-01-04 ENCOUNTER — Other Ambulatory Visit: Payer: Self-pay

## 2023-01-26 ENCOUNTER — Other Ambulatory Visit: Payer: Self-pay

## 2023-01-30 ENCOUNTER — Other Ambulatory Visit: Payer: Self-pay

## 2023-01-30 ENCOUNTER — Other Ambulatory Visit (HOSPITAL_COMMUNITY): Payer: Self-pay

## 2023-01-30 MED ORDER — KISQALI (600 MG DOSE) 200 MG PO TBPK
ORAL_TABLET | ORAL | 0 refills | Status: DC
  Filled 2023-01-31: qty 63, 28d supply, fill #0

## 2023-01-31 ENCOUNTER — Other Ambulatory Visit: Payer: Self-pay

## 2023-01-31 ENCOUNTER — Encounter (HOSPITAL_COMMUNITY): Payer: Self-pay

## 2023-01-31 ENCOUNTER — Other Ambulatory Visit (HOSPITAL_COMMUNITY): Payer: Self-pay

## 2023-01-31 NOTE — Progress Notes (Signed)
 Specialty Pharmacy Initial Fill Coordination Note  Melanie Davis is a 62 y.o. female contacted today regarding initial fill of specialty medication(s) Ribociclib  Succinate (Kisqali  (600 MG Dose))   Patient requested Pickup at [facility] at [facility] date: 02/02/23   Medication will be filled on 1/2.   Patient is aware of $0 copayment.

## 2023-01-31 NOTE — Progress Notes (Signed)
 Specialty Pharmacy Ongoing Clinical Assessment Note  Melanie Davis is a 62 y.o. female who is being followed by the specialty pharmacy service for RxSp Oncology   Patient's specialty medication(s) reviewed today: Ribociclib  Succinate (Kisqali  (600 MG Dose))  Patient started medication on 01/23/23 at [facility]. She will start next Cycle on 02/20/23. She requested to pick up her refill on 02/02/23. She reports she has had some nausea but is tolerable with food.   Missed doses in the last 4 weeks: 0   Patient/Caregiver did not have any additional questions or concerns.   Therapeutic benefit summary: Unable to assess   Adverse events/side effects summary: Unable to assess   Patient's therapy is appropriate to: Initiate    Goals Addressed             This Visit's Progress    Stabilization of disease       Patient is initiating therapy. Patient will maintain adherence         Follow up:  3 months  Mitzie GORMAN [facility]t Specialty Pharmacist

## 2023-02-02 ENCOUNTER — Other Ambulatory Visit (HOSPITAL_COMMUNITY): Payer: Self-pay

## 2023-02-02 ENCOUNTER — Other Ambulatory Visit: Payer: Self-pay

## 2023-02-03 ENCOUNTER — Other Ambulatory Visit: Payer: Self-pay

## 2023-02-13 ENCOUNTER — Other Ambulatory Visit (HOSPITAL_COMMUNITY): Payer: Self-pay

## 2023-02-13 ENCOUNTER — Other Ambulatory Visit: Payer: Self-pay

## 2023-02-21 ENCOUNTER — Other Ambulatory Visit (HOSPITAL_COMMUNITY): Payer: Self-pay

## 2023-02-21 MED ORDER — HYDROXYZINE HCL 25 MG PO TABS
25.0000 mg | ORAL_TABLET | Freq: Three times a day (TID) | ORAL | 0 refills | Status: AC | PRN
  Filled 2023-02-21: qty 30, 10d supply, fill #0

## 2023-03-01 ENCOUNTER — Other Ambulatory Visit: Payer: Self-pay

## 2023-03-03 ENCOUNTER — Other Ambulatory Visit: Payer: Self-pay | Admitting: Pharmacy Technician

## 2023-03-03 ENCOUNTER — Other Ambulatory Visit: Payer: Self-pay

## 2023-03-03 MED ORDER — RIBOCICLIB SUCC (600 MG DOSE) 200 MG PO TBPK
ORAL_TABLET | ORAL | 0 refills | Status: DC
  Filled 2023-03-03: qty 63, 28d supply, fill #0

## 2023-03-03 NOTE — Progress Notes (Signed)
Specialty Pharmacy Refill Coordination Note  Melanie Davis is a 63 y.o. female contacted today regarding refills of specialty medication(s) Ribociclib Succinate (Kisqali (600 MG Dose))   Patient requested Pickup at [facility] at [facility] date: 03/07/23   Medication will be filled on 03/06/23.     New Cycle Start 2/9; Patient going out town on 2/5 & aware we sent refill request & to check with [place] Monday morning to make sure we have gotten a new rx

## 2023-03-03 NOTE — Progress Notes (Signed)
Specialty Pharmacy Ongoing Clinical Assessment Note  Melanie Davis is a 63 y.o. female who is being followed by the specialty pharmacy service for RxSp Oncology   Patient's specialty medication(s) reviewed today: Ribociclib Succinate (KISQALI 600mg  Daily Dose)   Missed doses in the last 4 weeks: 0   Patient/Caregiver did not have any additional questions or concerns.   Therapeutic benefit summary: Patient is achieving benefit   Adverse events/side effects summary: Experienced adverse events/side effects (fatigue)   Patient's therapy is appropriate to: Continue    Goals Addressed             This Visit's Progress    Stabilization of disease       Patient is unable to be assessed as therapy was recently initiated. Patient will maintain adherence         Follow up:  3 months  Otto Herb Specialty Pharmacist

## 2023-03-06 ENCOUNTER — Other Ambulatory Visit: Payer: Self-pay

## 2023-03-30 ENCOUNTER — Other Ambulatory Visit (HOSPITAL_COMMUNITY): Payer: Self-pay

## 2023-03-30 ENCOUNTER — Other Ambulatory Visit: Payer: Self-pay

## 2023-03-30 MED ORDER — TRAZODONE HCL 50 MG PO TABS
75.0000 mg | ORAL_TABLET | Freq: Every day | ORAL | 3 refills | Status: AC
  Filled 2023-03-30: qty 135, 90d supply, fill #0
  Filled 2023-07-31: qty 135, 90d supply, fill #1
  Filled 2023-11-02: qty 135, 90d supply, fill #2

## 2023-03-31 ENCOUNTER — Other Ambulatory Visit: Payer: Self-pay

## 2023-04-03 ENCOUNTER — Other Ambulatory Visit: Payer: Self-pay

## 2023-04-03 MED ORDER — RIBOCICLIB SUCC (600 MG DOSE) 200 MG PO TBPK
ORAL_TABLET | ORAL | 0 refills | Status: DC
  Filled 2023-04-03: qty 63, 28d supply, fill #0

## 2023-04-03 NOTE — Progress Notes (Signed)
 Specialty Pharmacy Refill Coordination Note  Melanie Davis is a 63 y.o. female contacted today regarding refills of specialty medication(s) Ribociclib Succinate (KISQALI 600mg  Daily Dose)   Patient requested Pickup at [facility] at [facility] date: 04/12/23   Medication will be filled on 04/11/23, pending refill approval.

## 2023-04-11 ENCOUNTER — Other Ambulatory Visit: Payer: Self-pay

## 2023-04-11 NOTE — Progress Notes (Signed)
 Patient has Kisqali credit card to cover $136. She will bring with her when she picks up.

## 2023-04-11 NOTE — Progress Notes (Signed)
 Copayment previously $0, this month $136. Routing encounter to Tiffany to address.

## 2023-05-03 ENCOUNTER — Other Ambulatory Visit: Payer: Self-pay

## 2023-05-04 ENCOUNTER — Other Ambulatory Visit: Payer: Self-pay

## 2023-05-04 NOTE — Progress Notes (Signed)
 Specialty Pharmacy Refill Coordination Note  Melanie Davis is a 63 y.o. female contacted today regarding refills of specialty medication(s) Ribociclib Succinate [name] 600mg  Daily Dose)   Patient requested Pickup at [facility] at [facility] date: 05/15/23   Medication will be filled on 05/12/23.

## 2023-05-04 NOTE — Progress Notes (Signed)
 Specialty Pharmacy Ongoing Clinical Assessment Note  Melanie Davis is a 63 y.o. female who is being followed by the specialty pharmacy service for RxSp Oncology   Patient's specialty medication(s) reviewed today: Ribociclib Succinate (KISQALI 600mg  Daily Dose)   Missed doses in the last 4 weeks: 0   Patient/Caregiver did not have any additional questions or concerns.   Therapeutic benefit summary: Patient is achieving benefit   Adverse events/side effects summary: -- (continued fatigue, patient rests when needed)   Patient's therapy is appropriate to: Continue    Goals Addressed             This Visit's Progress    Stabilization of disease       Patient is on track. Patient will maintain adherence. Provider notes from January do not show concern of progression. Scans were scheduled for February and next provider follow up for tomorrow.           Follow up:  3 months  Otto Herb Specialty Pharmacist

## 2023-05-09 ENCOUNTER — Other Ambulatory Visit: Payer: Self-pay

## 2023-05-09 MED ORDER — RIBOCICLIB SUCC (600 MG DOSE) 200 MG PO TBPK
ORAL_TABLET | ORAL | 0 refills | Status: DC
Start: 2023-05-08 — End: 2023-06-09
  Filled 2023-05-09: qty 63, 28d supply, fill #0

## 2023-05-12 ENCOUNTER — Other Ambulatory Visit: Payer: Self-pay

## 2023-06-07 ENCOUNTER — Other Ambulatory Visit: Payer: Self-pay

## 2023-06-09 ENCOUNTER — Other Ambulatory Visit (HOSPITAL_COMMUNITY): Payer: Self-pay

## 2023-06-09 ENCOUNTER — Other Ambulatory Visit: Payer: Self-pay

## 2023-06-09 MED ORDER — RIBOCICLIB SUCC (600 MG DOSE) 200 MG PO TBPK
ORAL_TABLET | ORAL | 0 refills | Status: DC
Start: 2023-06-09 — End: 2023-07-10
  Filled 2023-06-09 (×2): qty 63, 28d supply, fill #0

## 2023-06-09 NOTE — Progress Notes (Signed)
 Specialty Pharmacy Refill Coordination Note  Melanie Davis is a 63 y.o. female contacted today regarding refills of specialty medication(s) Ribociclib  Succinate (KISQALI  600mg  Daily Dose)   Patient requested Pickup at [facility] at [facility] date: 06/13/23   Medication will be filled on 06/13/23.    This fill date is pending response to refill request from provider. Patient is aware and if they have not received fill by intended date they must follow up with pharmacy.

## 2023-06-12 ENCOUNTER — Other Ambulatory Visit: Payer: Self-pay

## 2023-06-13 ENCOUNTER — Other Ambulatory Visit (HOSPITAL_COMMUNITY): Payer: Self-pay

## 2023-07-03 ENCOUNTER — Other Ambulatory Visit: Payer: Self-pay

## 2023-07-04 ENCOUNTER — Other Ambulatory Visit: Payer: Self-pay | Admitting: Pharmacy Technician

## 2023-07-04 ENCOUNTER — Other Ambulatory Visit: Payer: Self-pay

## 2023-07-04 ENCOUNTER — Other Ambulatory Visit (HOSPITAL_COMMUNITY): Payer: Self-pay

## 2023-07-04 NOTE — Progress Notes (Signed)
 Specialty Pharmacy Ongoing Clinical Assessment Note  Melanie Davis is a 63 y.o. female who is being followed by the specialty pharmacy service for RxSp Oncology   Patient's specialty medication(s) reviewed today: Ribociclib  Succinate (KISQALI  600mg  Daily Dose)   Missed doses in the last 4 weeks: 0   Patient/Caregiver did not have any additional questions or concerns.   Therapeutic benefit summary: Patient is achieving benefit   Adverse events/side effects summary: Experienced adverse events/side effects (Patient reports diarrhea which is well-controlled with Imodium, she also endorses fatigue and some symptoms of palmar-plantar syndrome (I recommended trying Udderly Smooth and Volaren gel OTC, patient will try this))   Patient's therapy is appropriate to: Continue    Goals Addressed             This Visit's Progress    Stabilization of disease   No change    Patient is on track. Patient will maintain adherence. Patient seen by an outside provider, notes are not available in EPIC.         Follow up: 3 months  Malachi Screws Specialty Pharmacist

## 2023-07-04 NOTE — Progress Notes (Signed)
 Specialty Pharmacy Refill Coordination Note  Melanie Davis is a 63 y.o. female contacted today regarding refills of specialty medication(s) Ribociclib  Succinate (KISQALI  600mg  Daily Dose)   Patient requested Pickup at [facility] at [facility] date: 07/13/23   Medication will be filled on 07/13/23.  This fill date is pending response to refill request from provider. Patient is aware and if they have not received fill by intended date they must follow up with pharmacy.

## 2023-07-10 ENCOUNTER — Other Ambulatory Visit: Payer: Self-pay

## 2023-07-10 MED ORDER — KISQALI (600 MG DOSE) 200 MG PO TBPK
ORAL_TABLET | ORAL | 0 refills | Status: DC
  Filled 2023-07-10: qty 63, 28d supply, fill #0

## 2023-07-12 ENCOUNTER — Other Ambulatory Visit: Payer: Self-pay

## 2023-07-28 ENCOUNTER — Other Ambulatory Visit (HOSPITAL_COMMUNITY): Payer: Self-pay

## 2023-08-02 ENCOUNTER — Other Ambulatory Visit (HOSPITAL_COMMUNITY): Payer: Self-pay

## 2023-08-07 ENCOUNTER — Other Ambulatory Visit: Payer: Self-pay

## 2023-08-07 NOTE — Progress Notes (Signed)
 Specialty Pharmacy Refill Coordination Note  Melanie Davis is a 63 y.o. female contacted today regarding refills of specialty medication(s) Ribociclib  Succinate (Kisqali  (600 MG Dose))   Patient requested Pickup at [facility] at [facility] date: 08/21/23   Medication will be filled on 08/18/23.

## 2023-08-10 ENCOUNTER — Other Ambulatory Visit (HOSPITAL_COMMUNITY): Payer: Self-pay

## 2023-08-10 MED ORDER — KISQALI (600 MG DOSE) 200 MG PO TBPK
ORAL_TABLET | ORAL | 0 refills | Status: DC
  Filled 2023-08-10: qty 63, 28d supply, fill #0

## 2023-08-18 ENCOUNTER — Other Ambulatory Visit: Payer: Self-pay

## 2023-08-25 ENCOUNTER — Other Ambulatory Visit (HOSPITAL_COMMUNITY): Payer: Self-pay

## 2023-09-03 ENCOUNTER — Encounter (HOSPITAL_COMMUNITY): Payer: Self-pay

## 2023-09-03 ENCOUNTER — Ambulatory Visit (INDEPENDENT_AMBULATORY_CARE_PROVIDER_SITE_OTHER): Payer: Self-pay

## 2023-09-03 ENCOUNTER — Ambulatory Visit (HOSPITAL_COMMUNITY): Payer: Self-pay | Admitting: Internal Medicine

## 2023-09-03 ENCOUNTER — Ambulatory Visit (HOSPITAL_COMMUNITY)
Admission: EM | Admit: 2023-09-03 | Discharge: 2023-09-03 | Disposition: A | Discharge status: Home / Self Care | Payer: Self-pay | Attending: Internal Medicine | Admitting: Internal Medicine

## 2023-09-03 DIAGNOSIS — M25521 Pain in right elbow: Secondary | ICD-10-CM

## 2023-09-03 DIAGNOSIS — Z79899 Other long term (current) drug therapy: Secondary | ICD-10-CM | POA: Insufficient documentation

## 2023-09-03 DIAGNOSIS — L03113 Cellulitis of right upper limb: Secondary | ICD-10-CM | POA: Insufficient documentation

## 2023-09-03 DIAGNOSIS — D84821 Immunodeficiency due to drugs: Secondary | ICD-10-CM | POA: Insufficient documentation

## 2023-09-03 DIAGNOSIS — M25421 Effusion, right elbow: Secondary | ICD-10-CM | POA: Insufficient documentation

## 2023-09-03 DIAGNOSIS — B379 Candidiasis, unspecified: Secondary | ICD-10-CM | POA: Insufficient documentation

## 2023-09-03 DIAGNOSIS — T3695XA Adverse effect of unspecified systemic antibiotic, initial encounter: Secondary | ICD-10-CM | POA: Insufficient documentation

## 2023-09-03 LAB — CBC WITH DIFFERENTIAL/PLATELET
Basophils Absolute: 0 K/uL (ref 0.0–0.1)
Basophils Relative: 0 %
Eosinophils Absolute: 0.5 K/uL (ref 0.0–0.5)
Eosinophils Relative: 10 %
HCT: 40.4 % (ref 36.0–46.0)
Hemoglobin: 13.6 g/dL (ref 12.0–15.0)
Lymphocytes Relative: 13 %
Lymphs Abs: 0.6 K/uL — ABNORMAL LOW (ref 0.7–4.0)
MCH: 36.3 pg — ABNORMAL HIGH (ref 26.0–34.0)
MCHC: 33.7 g/dL (ref 30.0–36.0)
MCV: 107.7 fL — ABNORMAL HIGH (ref 80.0–100.0)
Monocytes Absolute: 0.2 K/uL (ref 0.1–1.0)
Monocytes Relative: 5 %
Neutro Abs: 3.3 K/uL (ref 1.7–7.7)
Neutrophils Relative %: 72 %
Platelets: 244 K/uL (ref 150–400)
RBC: 3.75 MIL/uL — ABNORMAL LOW (ref 3.87–5.11)
RDW: 14.3 % (ref 11.5–15.5)
WBC: 4.6 K/uL (ref 4.0–10.5)
nRBC: 0 % (ref 0.0–0.2)

## 2023-09-03 LAB — BASIC METABOLIC PANEL WITH GFR
Anion gap: 8 (ref 5–15)
BUN: 15 mg/dL (ref 6–20)
CO2: 27 mmol/L (ref 22–32)
Calcium: 9.3 mg/dL (ref 8.9–10.3)
Chloride: 105 mmol/L (ref 98–111)
Creatinine, Ser: 1.07 mg/dL — ABNORMAL HIGH (ref 0.44–1.00)
GFR, Estimated: 59 mL/min — ABNORMAL LOW (ref >60)
Glucose, Bld: 99 mg/dL (ref 70–99)
Potassium: 5.3 mmol/L — ABNORMAL HIGH (ref 3.5–5.1)
Sodium: 140 mmol/L (ref 135–145)

## 2023-09-03 MED ORDER — AMOXICILLIN-POT CLAVULANATE 875-125 MG PO TABS: 1.0000 | ORAL_TABLET | Freq: Two times a day (BID) | ORAL | 0 refills | Status: AC

## 2023-09-03 MED ORDER — FLUCONAZOLE 150 MG PO TABS: 150.0000 mg | ORAL_TABLET | ORAL | 0 refills | Status: AC

## 2023-09-03 NOTE — Discharge Instructions (Addendum)
 You have cellulitis which is a superficial skin infection.  Take antibiotic every 12 hours with a snack/food for the next 7 days. Watch for worsening signs of infection to the lesion such as spreading redness, swelling, pus drainage, pain, fever/chills. If redness grows significantly in the next 24-48 hours, please return to urgent care for reevaluation.  Return if you develop fever, chills, nausea, vomiting, etc as well.  If you develop any new or worsening symptoms or if your symptoms do not start to improve, please return here or follow-up with your primary care provider. If your symptoms are severe, please go to the emergency room.

## 2023-09-03 NOTE — ED Provider Notes (Signed)
 [facility]    CSN: [id number] Arrival date & time: 09/03/23  1211      History   Chief Complaint Chief Complaint  Patient presents with   Joint Swelling    Right elbow    HPI Melanie Davis is a 63 y.o. female.   Melanie Davis is a 63 y.o. female presenting for chief complaint of right elbow swelling, redness, and pain that started 4 days ago.  She receives injections of immunosuppressant/biologic medications to prevent recurrence of malignancy and states these cause rash/itching that she usually manages with over-the-counter medications.  She most recently received injection 10 days ago and has had itchy rash to the elbow with open wounds.  She suspects one of the wounds became infected as she now has redness, swelling, and pain to the right elbow.  Redness and swelling have worsened in the last 24 to 48 hours.  Denies recent trauma/injuries to the right elbow, numbness and tingling to the right hand, streaking redness to the distal forearm/proximal upper arm, fever, chills, body aches, nausea, vomiting, and fatigue.  No recent antibiotic or steroids in the last 90 days.  Elbow remains a little bit itchy but is mostly tender.      Past Medical History:  Diagnosis Date   Asthma    Cancer (HCC)    Family history of breast cancer    Family history of lung cancer     Patient Active Problem List   Diagnosis Date Noted   Family history of breast cancer    Family history of lung cancer    Primary malignant neoplasm of breast with metastasis (HCC) 02/21/2020    Past Surgical History:  Procedure Laterality Date   BREAST SURGERY     THYROID SURGERY      OB History   No obstetric history on file.      Home Medications    Prior to Admission medications   Medication Sig Start Date End Date Taking? Authorizing Provider  amoxicillin -clavulanate (AUGMENTIN ) 875-125 MG tablet Take 1 tablet by mouth every 12 (twelve) hours. 09/03/23  Yes Enedelia Dorna HERO, FNP   fluconazole  (DIFLUCAN ) 150 MG tablet Take 1 tablet (150 mg total) by mouth every 3 (three) days. 09/03/23  Yes Enedelia Dorna HERO, FNP  albuterol (VENTOLIN HFA) 108 (90 Base) MCG/ACT inhaler Inhale 1 puff into the lungs every 4 (four) hours as needed for wheezing or shortness of breath.    [provider]  ALPRAZolam  (XANAX ) 0.5 MG tablet Take 1 tablet (0.5 mg) by mouth at bedtime as needed for sleep or anxiety 09/06/22     beclomethasone (QVAR) 40 MCG/ACT inhaler Inhale into the lungs 2 (two) times daily.    [provider]  DULoxetine  (CYMBALTA ) 20 MG capsule Take 1 capsule (20 mg total) by mouth daily. 01/10/22     fluticasone (FLONASE) 50 MCG/ACT nasal spray Place 1 spray into both nostrils daily as needed for allergies or rhinitis.    [provider]  fulvestrant (FASLODEX) 250 MG/5ML injection Inject 500 mg into the muscle every 30 (thirty) days.    [provider]  hydrOXYzine  (ATARAX ) 25 MG tablet Take 1 tablet (25 mg total) by mouth 3 (three) times daily as needed for itching. 02/21/23     levothyroxine (SYNTHROID) 125 MCG tablet Take 125 mcg by mouth daily before breakfast.    [provider]  lisinopril (ZESTRIL) 5 MG tablet Take 5 mg by mouth daily.    [provider]  ribociclib  succ (KISQALI , 600 MG DOSE,) 200 MG Therapy Pack Take 3 tablets (600 mg total) by mouth once daily For 21 days, followed by 7 days off.  ** Avoid grapefruit and grapefruit juice products.** 08/10/23     traZODone  (DESYREL ) 50 MG tablet Take 1.5 tablets (75 mg total) by mouth at bedtime. 03/30/23     triamcinolone  ointment (KENALOG ) 0.5 % Apply 1 application topically 2 (two) times daily. 02/21/20   Odean Potts, MD    Family History Family History  Problem Relation Age of Onset   Stroke Mother    Lung cancer Mother 22       smoker   Diabetes Mother    COPD Mother    Alcoholism Mother    Pancreatitis Father    Renal Disease Father    Hypertension Father     Obesity Father    Breast cancer Sister        dx early 41s   Stroke Sister 54   Macular degeneration Brother    Hypertension Brother    Breast cancer Maternal Aunt        dx unknown age   Intracerebral hemorrhage Maternal Grandfather    Breast cancer Cousin 57   Cancer Cousin        Anal cancer, dx 70s    Social History Social History   Tobacco Use   Smoking status: Never   Smokeless tobacco: Never  Vaping Use   Vaping status: Never Used  Substance Use Topics   Alcohol use: Yes    Comment: social   Drug use: Never     Allergies   Patient has no known allergies.   Review of Systems Review of Systems Per HPI  Physical Exam Triage Vital Signs ED Triage Vitals  Encounter Vitals Group     BP 09/03/23 1237 (!) 154/73     Girls Systolic BP Percentile --      Girls Diastolic BP Percentile --      Boys Systolic BP Percentile --      Boys Diastolic BP Percentile --      Pulse Rate 09/03/23 1237 73     Resp 09/03/23 1237 16     Temp 09/03/23 1237 (!) 97.5 F (36.4 C)     Temp Source 09/03/23 1237 Oral     SpO2 09/03/23 1237 95 %     Weight --      Height --      Head Circumference --      Peak Flow --      Pain Score 09/03/23 1239 3     Pain Loc --      Pain Education --      Exclude from Growth Chart --    No data found.  Updated Vital Signs BP (!) 154/73 (BP Location: Left Arm)   Pulse 73   Temp (!) 97.5 F (36.4 C) (Oral)   Resp 16   SpO2 95%   Visual Acuity Right Eye Distance:   Left Eye Distance:   Bilateral Distance:    Right Eye Near:   Left Eye Near:    Bilateral Near:     Physical Exam Vitals and nursing note reviewed.  Constitutional:      Appearance: She is not ill-appearing or toxic-appearing.  HENT:     Head: Normocephalic and atraumatic.     Right Ear: Hearing and external ear normal.     Left Ear: Hearing and external ear normal.     Nose: Nose  normal.     Mouth/Throat:     Lips: Pink.  Eyes:     General: Lids are  normal. Vision grossly intact. Gaze aligned appropriately.     Extraocular Movements: Extraocular movements intact.     Conjunctiva/sclera: Conjunctivae normal.  Pulmonary:     Effort: Pulmonary effort is normal.  Musculoskeletal:     Right elbow: Swelling and effusion present. No deformity or lacerations. Normal range of motion. Tenderness (Diffuse tenderness to the posterior right elbow joint) present.     Left elbow: Normal.     Cervical back: Neck supple.     Comments: Erythema, swelling, warmth, and tenderness to the posterior right elbow on exam.  Normal range of motion, 5/5 strength against resistance with flexion extension of the right elbow joint.  +2 right radial and right brachial pulse.  Less than 2 cap refill to distal right hand. 5/5 right hand grip strength.   Skin:    General: Skin is warm and dry.     Capillary Refill: Capillary refill takes less than 2 seconds.     Findings: No rash.  Neurological:     General: No focal deficit present.     Mental Status: She is alert and oriented to person, place, and time. Mental status is at baseline.     Cranial Nerves: No dysarthria or facial asymmetry.  Psychiatric:        Mood and Affect: Mood normal.        Speech: Speech normal.        Behavior: Behavior normal.        Thought Content: Thought content normal.        Judgment: Judgment normal.      UC Treatments / Results  Labs (all labs ordered are listed, but only abnormal results are displayed) Labs Reviewed  CBC WITH DIFFERENTIAL/PLATELET  BASIC METABOLIC PANEL WITH GFR    EKG   Radiology DG Elbow Complete Right Result Date: 09/03/2023 CLINICAL DATA:  Pain and swelling for 4 days EXAM: RIGHT ELBOW - COMPLETE 3+ VIEW COMPARISON:  None Available. FINDINGS: There is no evidence of fracture, dislocation, or joint effusion. There is no evidence of arthropathy or other focal bone abnormality. Medial Subcutaneus soft tissue edema. IMPRESSION: 1. No acute displaced  fracture or dislocation. 2. Medial elbow Subcutaneus soft tissue edema. Electronically Signed   By: Morgane  Naveau M.D.   On: 09/03/2023 13:33    Procedures Procedures (including critical care time)  Medications Ordered in UC Medications - No data to display  Initial Impression / Assessment and Plan / UC Course  I have reviewed the triage vital signs and the nursing notes.  Pertinent labs & imaging results that were available during my care of the patient were reviewed by me and considered in my medical decision making (see chart for details).   1.  Cellulitis of right elbow, right elbow pain, antibiotic induced yeast infection, immunosuppression due to drug therapy Presentation is suspicious for cellulitis of the right elbow versus septic arthritis. No systemic symptoms or lymphangitis on exam. X-ray of right elbow shows no acute abnormality with medial subcutaneous soft tissue swelling. Recent blood work from June 2025 shows intact renal and liver function. Augmentin  antibiotic twice daily for 7 days ordered to treat cellulitis of right elbow. Requests empiric treatment with Diflucan  for yeast vaginitis associated with antibiotic use, this has been ordered.  Strict ER return precautions discussed. Blood work has been drawn today to evaluate for signs of systemic infection  due to immunosuppression due to drug therapy. Staff will call if blood work is abnormal.  Counseled patient on potential for adverse effects with medications prescribed/recommended today, strict ER and return-to-clinic precautions discussed, patient verbalized understanding.    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  Take antibiotic every 12 hours with a snack/food  for the next 7 days. Watch for worsening signs of infection to the lesion such as spreading redness, swelling, pus drainage, pain, fever/chills. If redness grows significantly in the next 24-48 hours, please return to urgent care for reevaluation.  Return if you develop fever, chills, nausea, vomiting, etc as well.  If you develop any new or worsening symptoms or if your symptoms do not start to improve, please return here or follow-up with your primary care provider. If your symptoms are severe, please go to the emergency room.     ED Prescriptions     Medication Sig Dispense Auth. Provider   fluconazole  (DIFLUCAN ) 150 MG tablet Take 1 tablet (150 mg total) by mouth every 3 (three) days. 2 tablet Enedelia Dorna HERO, FNP   amoxicillin -clavulanate (AUGMENTIN ) 875-125 MG tablet Take 1 tablet by mouth every 12 (twelve) hours. 14 tablet Enedelia Dorna HERO, FNP      PDMP not reviewed this encounter.   Enedelia Dorna HERO, [place] 09/03/23 1409

## 2023-09-03 NOTE — ED Triage Notes (Signed)
 Patient here today with c/o right elbow pain and swelling since Thursday.

## 2023-09-12 ENCOUNTER — Other Ambulatory Visit: Payer: Self-pay

## 2023-09-14 ENCOUNTER — Other Ambulatory Visit: Payer: Self-pay

## 2023-09-14 ENCOUNTER — Other Ambulatory Visit: Payer: Self-pay | Admitting: Pharmacy Technician

## 2023-09-14 NOTE — Progress Notes (Signed)
 Specialty Pharmacy Refill Coordination Note  Melanie Davis is a 63 y.o. female contacted today regarding refills of specialty medication(s) Ribociclib  Succinate (Kisqali  (600 MG Dose))   Patient requested Pickup at [facility] at [facility] date: 10/18/23   Medication will be filled on 10/18/23.  This fill date is pending response to refill request from provider. Patient is aware and if they have not received fill by intended date they must follow up with pharmacy.   Patient reported having an extra pack for another 3 weeks - currently on day 4 of break.

## 2023-09-19 ENCOUNTER — Other Ambulatory Visit: Payer: Self-pay

## 2023-09-19 NOTE — Progress Notes (Signed)
 Specialty Pharmacy Ongoing Clinical Assessment Note  Melanie Davis is a 63 y.o. female who is being followed by the specialty pharmacy service for RxSp Oncology   Patient's specialty medication(s) reviewed today: No data recorded  Missed doses in the last 4 weeks: 0   Patient/Caregiver did not have any additional questions or concerns.   No data recorded  Adverse events/side effects summary: Experienced adverse events/side effects (still dealing with fatigue, but otherwise tolerable)   Patient's therapy is appropriate to: Continue    Goals Addressed             This Visit's Progress    Stabilization of disease   On track    Patient is on track. Patient will maintain adherence. Per visit on 07/31/23, scans were reviewed at that appointment and disease is clinically stable.         Follow up: 3 months  [facility]

## 2023-09-26 ENCOUNTER — Other Ambulatory Visit: Payer: Self-pay

## 2023-09-26 MED ORDER — KISQALI (600 MG DOSE) 200 MG PO TBPK
ORAL_TABLET | ORAL | 0 refills | Status: DC
  Filled 2023-09-26: qty 63, 28d supply, fill #0

## 2023-10-11 ENCOUNTER — Other Ambulatory Visit: Payer: Self-pay

## 2023-10-13 ENCOUNTER — Other Ambulatory Visit: Payer: Self-pay

## 2023-10-13 ENCOUNTER — Other Ambulatory Visit (HOSPITAL_COMMUNITY): Payer: Self-pay

## 2023-10-13 NOTE — Progress Notes (Signed)
 Patient called  requesting a earlier pick up date. Medication will be filled 10/13/23 for patient pick up.

## 2023-11-02 ENCOUNTER — Encounter (INDEPENDENT_AMBULATORY_CARE_PROVIDER_SITE_OTHER): Payer: Self-pay

## 2023-11-02 ENCOUNTER — Other Ambulatory Visit: Payer: Self-pay

## 2023-11-02 ENCOUNTER — Other Ambulatory Visit: Payer: Self-pay | Admitting: Pharmacy Technician

## 2023-11-02 NOTE — Progress Notes (Signed)
 Specialty Pharmacy Refill Coordination Note  Melanie Davis is a 63 y.o. female contacted today regarding refills of specialty medication(s)  Ribociclib  Succinate (Kisqali  (600 MG Dose))      Patient requested (Patient-Rptd) Pickup at [facility] at [facility] date: (Patient-Rptd) 11/10/23   Medication will be filled on 11/09/23.  RR sent to MD

## 2023-11-03 ENCOUNTER — Other Ambulatory Visit (HOSPITAL_COMMUNITY): Payer: Self-pay

## 2023-11-03 ENCOUNTER — Other Ambulatory Visit: Payer: Self-pay

## 2023-11-03 MED ORDER — RIBOCICLIB SUCC (600 MG DOSE) 200 MG PO TBPK
ORAL_TABLET | ORAL | 0 refills | Status: DC
  Filled 2023-11-03: qty 63, 28d supply, fill #0

## 2023-11-09 ENCOUNTER — Other Ambulatory Visit (HOSPITAL_COMMUNITY): Payer: Self-pay

## 2023-11-30 ENCOUNTER — Encounter (INDEPENDENT_AMBULATORY_CARE_PROVIDER_SITE_OTHER): Payer: Self-pay

## 2023-11-30 ENCOUNTER — Other Ambulatory Visit: Payer: Self-pay

## 2023-11-30 NOTE — Progress Notes (Signed)
 Specialty Pharmacy Refill Coordination Note  Melanie Davis is a 63 y.o. female contacted today regarding refills of specialty medication(s) Ribociclib  Succinate (KISQALI  600mg  Daily Dose)   Patient requested Pickup at [facility] at [facility] date: 12/13/23   Medication will be filled on: 12/12/23   Refill request is pending.

## 2023-12-05 ENCOUNTER — Other Ambulatory Visit: Payer: Self-pay

## 2023-12-05 NOTE — Progress Notes (Signed)
 Specialty Pharmacy Ongoing Clinical Assessment Note  Melanie Davis is a 63 y.o. female who is being followed by the specialty pharmacy service for RxSp Oncology   Patient's specialty medication(s) reviewed today: Ribociclib  Succinate (KISQALI  600mg  Daily Dose)   Missed doses in the last 4 weeks: 0   Patient/Caregiver did not have any additional questions or concerns.   Therapeutic benefit summary: Patient is achieving benefit   Adverse events/side effects summary: No adverse events/side effects   Patient's therapy is appropriate to: Continue    Goals Addressed             This Visit's Progress    Stabilization of disease   On track    Patient is on track. Patient will maintain adherence. Per visit on 11/16/23 scans were reviewed at that appointment and disease is clinically stable.         Follow up: 6 months, Pt been on therapy since November 2024  [facility] Specialty Pharmacist

## 2023-12-07 ENCOUNTER — Other Ambulatory Visit (HOSPITAL_COMMUNITY): Payer: Self-pay

## 2023-12-07 ENCOUNTER — Other Ambulatory Visit: Payer: Self-pay

## 2023-12-07 MED ORDER — KISQALI (600 MG DOSE) 200 MG PO TBPK
ORAL_TABLET | ORAL | 1 refills | Status: DC
  Filled 2023-12-07: qty 63, 28d supply, fill #0
  Filled 2024-01-03: qty 63, 28d supply, fill #1

## 2023-12-12 ENCOUNTER — Other Ambulatory Visit: Payer: Self-pay

## 2024-01-03 ENCOUNTER — Other Ambulatory Visit: Payer: Self-pay

## 2024-01-03 ENCOUNTER — Other Ambulatory Visit: Payer: Self-pay | Admitting: Pharmacy Technician

## 2024-01-03 NOTE — Progress Notes (Signed)
 Specialty Pharmacy Refill Coordination Note  Jacqulin Brandenburger is a 63 y.o. female contacted today regarding refills of specialty medication(s) Ribociclib  Succinate (KISQALI  600mg  Daily Dose)   Patient requested (Patient-Rptd) Pickup at [facility] at [facility] date: (Patient-Rptd) 01/12/24   Medication will be filled on: 01/11/2024

## 2024-01-11 ENCOUNTER — Other Ambulatory Visit: Payer: Self-pay

## 2024-01-12 ENCOUNTER — Other Ambulatory Visit (HOSPITAL_COMMUNITY): Payer: Self-pay

## 2024-01-15 ENCOUNTER — Other Ambulatory Visit (HOSPITAL_COMMUNITY): Payer: Self-pay

## 2024-01-15 MED ORDER — PREDNISONE 10 MG PO TABS
ORAL_TABLET | ORAL | 0 refills | Status: AC
  Filled 2024-01-15: qty 42, 18d supply, fill #0

## 2024-01-23 ENCOUNTER — Other Ambulatory Visit: Payer: Self-pay

## 2024-01-31 ENCOUNTER — Other Ambulatory Visit: Payer: Self-pay

## 2024-02-02 ENCOUNTER — Other Ambulatory Visit: Payer: Self-pay

## 2024-02-02 ENCOUNTER — Other Ambulatory Visit (HOSPITAL_COMMUNITY): Payer: Self-pay

## 2024-02-16 ENCOUNTER — Other Ambulatory Visit: Payer: Self-pay

## 2024-02-16 MED ORDER — KISQALI (600 MG DOSE) 200 MG PO TBPK
ORAL_TABLET | ORAL | 1 refills | Status: AC
  Filled 2024-02-21: qty 63, 28d supply, fill #0

## 2024-02-21 ENCOUNTER — Other Ambulatory Visit: Payer: Self-pay

## 2024-02-22 ENCOUNTER — Other Ambulatory Visit: Payer: Self-pay

## 2024-02-22 NOTE — Progress Notes (Signed)
 Specialty Pharmacy Refill Coordination Note  Melanie Davis is a 64 y.o. female contacted today regarding refills of specialty medication(s) Ribociclib  Succinate (Kisqali  (600 MG Dose))   Patient requested (Patient-Rptd) Pickup at [facility] at [facility] date: (Patient-Rptd) 02/27/24   Medication will be filled on: 02/23/24

## 2024-02-23 ENCOUNTER — Other Ambulatory Visit: Payer: Self-pay

## 2024-03-07 ENCOUNTER — Other Ambulatory Visit: Payer: Self-pay
